# Patient Record
Sex: Female | Born: 1945 | Race: White | Hispanic: No | Marital: Married | Smoking: Former smoker
Health system: Southern US, Community
[De-identification: ages and names within clinical notes are randomized; demographics above are authoritative.]

## PROBLEM LIST (undated history)

## (undated) DIAGNOSIS — I6529 Occlusion and stenosis of unspecified carotid artery: Secondary | ICD-10-CM

## (undated) DIAGNOSIS — I219 Acute myocardial infarction, unspecified: Secondary | ICD-10-CM

## (undated) DIAGNOSIS — I10 Essential (primary) hypertension: Secondary | ICD-10-CM

## (undated) DIAGNOSIS — H269 Unspecified cataract: Secondary | ICD-10-CM

## (undated) DIAGNOSIS — E785 Hyperlipidemia, unspecified: Secondary | ICD-10-CM

## (undated) DIAGNOSIS — M1990 Unspecified osteoarthritis, unspecified site: Secondary | ICD-10-CM

## (undated) DIAGNOSIS — I2510 Atherosclerotic heart disease of native coronary artery without angina pectoris: Secondary | ICD-10-CM

## (undated) HISTORY — DX: Occlusion and stenosis of unspecified carotid artery: I65.29

## (undated) HISTORY — DX: Essential (primary) hypertension: I10

## (undated) HISTORY — DX: Unspecified osteoarthritis, unspecified site: M19.90

## (undated) HISTORY — PX: CARDIAC CATHETERIZATION: SHX172

## (undated) HISTORY — DX: Atherosclerotic heart disease of native coronary artery without angina pectoris: I25.10

## (undated) HISTORY — DX: Hyperlipidemia, unspecified: E78.5

## (undated) HISTORY — DX: Acute myocardial infarction, unspecified: I21.9

## (undated) HISTORY — DX: Unspecified cataract: H26.9

---
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
## 1995-12-11 HISTORY — PX: CLAVICLE SURGERY: SHX598

## 2012-12-10 HISTORY — PX: AORTIC VALVE REPLACEMENT (AVR)/CORONARY ARTERY BYPASS GRAFTING (CABG): SHX5725

## 2013-04-09 DIAGNOSIS — I219 Acute myocardial infarction, unspecified: Secondary | ICD-10-CM

## 2013-04-09 HISTORY — DX: Acute myocardial infarction, unspecified: I21.9

## 2013-06-08 DIAGNOSIS — Z951 Presence of aortocoronary bypass graft: Secondary | ICD-10-CM | POA: Insufficient documentation

## 2013-09-02 DIAGNOSIS — I6529 Occlusion and stenosis of unspecified carotid artery: Secondary | ICD-10-CM | POA: Insufficient documentation

## 2014-01-21 DIAGNOSIS — E785 Hyperlipidemia, unspecified: Secondary | ICD-10-CM | POA: Insufficient documentation

## 2014-01-21 DIAGNOSIS — I255 Ischemic cardiomyopathy: Secondary | ICD-10-CM | POA: Insufficient documentation

## 2014-10-18 HISTORY — PX: CAROTID ENDARTERECTOMY: SUR193

## 2015-02-17 DIAGNOSIS — C519 Malignant neoplasm of vulva, unspecified: Secondary | ICD-10-CM | POA: Insufficient documentation

## 2015-07-15 ENCOUNTER — Ambulatory Visit: Payer: Medicare Other | Attending: Gynecology | Admitting: Gynecology

## 2015-07-15 ENCOUNTER — Encounter: Payer: Self-pay | Admitting: Gynecology

## 2015-07-15 VITALS — BP 178/80 | HR 94 | Temp 98.2°F | Resp 20 | Ht 63.0 in | Wt 175.8 lb

## 2015-07-15 DIAGNOSIS — C519 Malignant neoplasm of vulva, unspecified: Secondary | ICD-10-CM | POA: Diagnosis present

## 2015-07-15 DIAGNOSIS — I2510 Atherosclerotic heart disease of native coronary artery without angina pectoris: Secondary | ICD-10-CM | POA: Insufficient documentation

## 2015-07-15 NOTE — Progress Notes (Signed)
Consult Note: Gyn-Onc   Amy Mayer 69 y.o. female  Chief Complaint  Patient presents with  . Vulvar Cancer    New consult    Assessment : Recurrent carcinoma of the vulva (right), new hypermetabolic external iliac lymph node.  Plan: I discussed management options with the patient. Clearly she has had maximum radiation to the vulva and chemotherapy is not particularly effective in controlling disease. Therefore, I recommended that she undergo a radical resection of the right vulvar lesion. Given her prior radiation therapy she is informed that healing will likely be a problem and that intraoperative decision will need to be made as to whether the wound will be closed at all. She is further informed that it may take several weeks for the wound to heal by secondary intention. The patient is informed that this is a palliative procedure.  With regard to the external iliac lymph node and possible pulmonary lesions, we will reassess the patient following surgery.  HPI: 69 year old white married female seen in consultation at the request of Dr. Marguerita Merles (radiation oncologist at [facility], [place]) regarding management of recurrent vulvar carcinoma.  The patient initially presented in the spring of 2016 with an enlarging right vulvar mass involving the right vulva measured approximate 8 x 4 x 3 cm. Biopsy revealed squamous cell carcinoma. Patient underwent a PET scan showing uptake in bilateral inguinal nodes and 3 nonspecific lung nodules. She received definitive chemoradiation therapy. Radiation course extended between 02/17/2015 and 04/19/2015. She received a total of 6300 cGy in 35 fractions to all PET avid disease and 4500 cGy in 25 fractions to all areas at risk of harboring microscopic disease including the pelvis. She had one break in therapy due to vulvitis. Initial follow-up examination showed complete response although on July 27 Dr. Erlene Quan found a  ulcerative lesion consistent with recurrent disease. A subsequent PET scan showed some residual activity in the vulvar region, resolution of left inguinal nodes, decrease in right inguinal nodes (although some activity remains) and 2 new right external iliac nodes. The 3 pulmonary nodules are stable although there is a new 5 mm pulmonary nodule as well.  Presently the patient has moderate amount of pain in the vulvar region and slight discomfort in her inguinal regions. She has mild lymphedema in the lower extremities.   Review of Systems:10 point review of systems is negative except as noted in interval history.   Vitals: Blood pressure 178/80, pulse 94, temperature 98.2 F (36.8 C), temperature source Oral, resp. rate 20, height 5\' 3"  (1.6 m), weight 175 lb 12.8 oz (79.742 kg), SpO2 96 %.  Physical Exam: General : The patient is a healthy woman in no acute distress.  HEENT: normocephalic, extraoccular movements normal; neck is supple without thyromegally  Lynphnodes: Supraclavicular and inguinal nodes not enlarged . There is thickening in the inguinal regions secondary to recent radiation therapy and "tanning" of the skin. Abdomen: Soft, non-tender, no ascites, no organomegally, no masses, no hernias  Pelvic:  EGBUS: There is a 3-1/2 cm ulcerative lesion on the right posterior vulva extending slightly into the vagina. It is not involving the anus and does not seem to be fixed although the patient is a considerable pain when I'm palpating. I'm unable to insert a speculum. Vagina:  Urethra and Bladder: Normal, non-tender  Cervix: Unable to visualize. Uterus: Could not evaluate.   Rectal: normal sphincter tone, no masses, no blood , the anus and rectum are free from the ulcerative lesion.  Lower extremities: Mild lymphedema in the upper thighs.     Allergies  Allergen Reactions  . Aspirin Other (See Comments)    "Only uncoated" makes stomach upset  . Codeine Nausea And Vomiting  .  Penicillins Hives  . Morphine Anxiety    Past Medical History  Diagnosis Date  . Arthritis   . Hypertension   . Myocardial infarction 04/2013  . Hyperlipidemia   . Coronary artery disease   . Cataracts, bilateral   . Carotid stenosis     Past Surgical History  Procedure Laterality Date  . Tonsillectomy  1964  . Aortic valve replacement (avr)/coronary artery bypass grafting (cabg)  2014  . Carotid endarterectomy  10/18/2014  . Clavicle surgery  1997  . Cardiac catheterization      Current Outpatient Prescriptions  Medication Sig Dispense Refill  . acetaminophen (TYLENOL) 500 MG tablet Take 500 mg by mouth as needed.     Marland Kitchen aspirin EC 81 MG tablet Take 81 mg by mouth daily.     . cetirizine (ZYRTEC) 10 MG tablet Take 10 mg by mouth as needed.     . Cholecalciferol (VITAMIN D-3) 1000 UNITS CAPS Take 1,000 Units by mouth daily.     . Magnesium 250 MG TABS Take 2 tablets by mouth daily.     . Multiple Vitamin (THERA) TABS Take 1 tablet by mouth daily.     . Omega-3 Fatty Acids (FISH OIL) 1000 MG CAPS Take 2 capsules by mouth daily.     . Potassium Gluconate 2 MEQ TABS Take 400 mg by mouth.    . pravastatin (PRAVACHOL) 80 MG tablet Take 80 mg by mouth daily.     . prochlorperazine (COMPAZINE) 10 MG tablet Take 10 mg by mouth as needed.      No current facility-administered medications for this visit.    History   Social History  . Marital Status: Married    Spouse Name: N/A  . Number of Children: N/A  . Years of Education: N/A   Occupational History  . Not on file.   Social History Main Topics  . Smoking status: Former Smoker    Quit date: 05/08/2013  . Smokeless tobacco: Not on file  . Alcohol Use: Yes     Comment: wine - several times a week  . Drug Use: No  . Sexual Activity: No   Other Topics Concern  . Not on file   Social History Narrative  . No narrative on file    Family History  Problem Relation Age of Onset  . Hypertension Father   . Coronary  artery disease Father   . Alcoholism Father   . Diabetes Maternal Aunt   . Arthritis Mother   . Heart disease Brother       Alvino Chapel, MD 07/15/2015, 10:08 AM       Consult Note: Gyn-Onc   Amy Mayer 69 y.o. female  Chief Complaint  Patient presents with  . Vulvar Cancer    New consult    Assessment :  Plan:  Interval History:   HPI:  Review of Systems:10 point review of systems is negative except as noted in interval history.   Vitals: Blood pressure 178/80, pulse 94, temperature 98.2 F (36.8 C), temperature source Oral, resp. rate 20, height 5\' 3"  (1.6 m), weight 175 lb 12.8 oz (79.742 kg), SpO2 96 %.  Physical Exam: General : The patient is a healthy woman in no acute distress.  HEENT: normocephalic, extraoccular movements normal;  neck is supple without thyromegally  Lynphnodes: Supraclavicular and inguinal nodes not enlarged  Abdomen: Soft, non-tender, no ascites, no organomegally, no masses, no hernias  Pelvic:  EGBUS: Normal female  Vagina: Normal, no lesions  Urethra and Bladder: Normal, non-tender  Cervix: Surgically absent  Uterus: Surgically absent  Bi-manual examination: Non-tender; no adenxal masses or nodularity  Rectal: normal sphincter tone, no masses, no blood  Lower extremities: No edema or varicosities. Normal range of motion      Allergies  Allergen Reactions  . Aspirin Other (See Comments)    "Only uncoated" makes stomach upset  . Codeine Nausea And Vomiting  . Penicillins Hives  . Morphine Anxiety    Past Medical History  Diagnosis Date  . Arthritis   . Hypertension   . Myocardial infarction 04/2013  . Hyperlipidemia   . Coronary artery disease   . Cataracts, bilateral   . Carotid stenosis     Past Surgical History  Procedure Laterality Date  . Tonsillectomy  1964  . Aortic valve replacement (avr)/coronary artery bypass grafting (cabg)  2014  . Carotid endarterectomy  10/18/2014  . Clavicle  surgery  1997  . Cardiac catheterization      Current Outpatient Prescriptions  Medication Sig Dispense Refill  . acetaminophen (TYLENOL) 500 MG tablet Take 500 mg by mouth as needed.     Marland Kitchen aspirin EC 81 MG tablet Take 81 mg by mouth daily.     . cetirizine (ZYRTEC) 10 MG tablet Take 10 mg by mouth as needed.     . Cholecalciferol (VITAMIN D-3) 1000 UNITS CAPS Take 1,000 Units by mouth daily.     . Magnesium 250 MG TABS Take 2 tablets by mouth daily.     . Multiple Vitamin (THERA) TABS Take 1 tablet by mouth daily.     . Omega-3 Fatty Acids (FISH OIL) 1000 MG CAPS Take 2 capsules by mouth daily.     . Potassium Gluconate 2 MEQ TABS Take 400 mg by mouth.    . pravastatin (PRAVACHOL) 80 MG tablet Take 80 mg by mouth daily.     . prochlorperazine (COMPAZINE) 10 MG tablet Take 10 mg by mouth as needed.      No current facility-administered medications for this visit.    History   Social History  . Marital Status: Married    Spouse Name: N/A  . Number of Children: N/A  . Years of Education: N/A   Occupational History  . Not on file.   Social History Main Topics  . Smoking status: Former Smoker    Quit date: 05/08/2013  . Smokeless tobacco: Not on file  . Alcohol Use: Yes     Comment: wine - several times a week  . Drug Use: No  . Sexual Activity: No   Other Topics Concern  . Not on file   Social History Narrative  . No narrative on file    Family History  Problem Relation Age of Onset  . Hypertension Father   . Coronary artery disease Father   . Alcoholism Father   . Diabetes Maternal Aunt   . Arthritis Mother   . Heart disease Brother       Alvino Chapel, MD 07/15/2015, 10:08 AM

## 2015-07-15 NOTE — Patient Instructions (Signed)
Plan for surgery with Dr. Fermin Schwab at [facility] on Tuesday, August 16.  Your surgery will include a right radical vulvectomy.  Your pre-operative appointment at [facility]) on Monday, August 8 at 11am.  Please call for any questions or concerns.

## 2015-08-05 ENCOUNTER — Ambulatory Visit: Payer: Medicare Other | Attending: Gynecology | Admitting: Gynecology

## 2015-08-05 ENCOUNTER — Encounter: Payer: Self-pay | Admitting: Gynecology

## 2015-08-05 VITALS — BP 147/61 | HR 69 | Temp 98.2°F | Resp 18 | Ht 63.0 in | Wt 173.6 lb

## 2015-08-05 DIAGNOSIS — C519 Malignant neoplasm of vulva, unspecified: Secondary | ICD-10-CM | POA: Insufficient documentation

## 2015-08-05 NOTE — Patient Instructions (Addendum)
Followup in 2 weeks as scheduled. Please call our office with any questions or concerns prior to this appointment.   Use the sitz bath twice daily and apply the silvadene cream twice daily (after sitz bath) until your next appointment.

## 2015-08-05 NOTE — Progress Notes (Signed)
Consult Note: Gyn-Onc   Amy Mayer 69 y.o. female  Chief Complaint  Patient presents with  . recurrent vulvar cancer    post-op check    Assessment : Recurrent carcinoma of the vulva (right) status post radical vulvectomy on 07/26/2015. Perineal wound separation.  Plan: I recommend the patient have a sitz bath twice a day, use the hand-held showerhead to irrigate the perineum, at the area dry, and then apply Silvadene cream. She return to see me in 2 weeks.  Interval history: Patient underwent extended right radical vulvectomy at [facility] on 07/26/2015. 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 Patient's initial postoperative course was uncomplicated. However she reports that in the last 2 or 3 days she's had some increased peritoneal drainage. She denies any fever or chills.  HPI: 69 year old white married female seen in consultation at the request of Dr. Marguerita Merles (radiation oncologist at [facility], [place]) regarding management of recurrent vulvar carcinoma.  The patient initially presented in the spring of 2016 with an enlarging right vulvar mass involving the right vulva measured approximate 8 x 4 x 3 cm. Biopsy revealed squamous cell carcinoma. Patient underwent a PET scan showing uptake in bilateral inguinal nodes and 3 nonspecific lung nodules. She received definitive chemoradiation therapy. Radiation course extended between 02/17/2015 and 04/19/2015. She received a total of 6300 cGy in 35 fractions to all PET avid disease and 4500 cGy in 25 fractions to all areas at risk of harboring microscopic disease including the pelvis. She had one break in therapy due to vulvitis. Initial follow-up examination showed complete response although on July 27 Dr. Erlene Quan found a ulcerative lesion consistent with recurrent disease. A subsequent PET scan showed some residual activity in the vulvar region,  resolution of left inguinal nodes, decrease in right inguinal nodes (although some activity remains) and 2 new right external iliac nodes. The 3 pulmonary nodules are stable although there is a new 5 mm pulmonary nodule as well.  Presently the patient has moderate amount of pain in the vulvar region and slight discomfort in her inguinal regions. She has mild lymphedema in the lower extremities.   She underwent extended radical vulvectomy of the right vulvar lesion on 07/26/2015 at [facility]. All surgical margins were negative.  Review of Systems:10 point review of systems is negative except as noted in interval history.   Vitals: Blood pressure 147/61, pulse 69, temperature 98.2 F (36.8 C), temperature source Oral, resp. rate 18, height 5\' 3"  (1.6 m), weight 173 lb 9.6 oz (78.744 kg), SpO2 98 %.  Physical Exam: General : The patient is a healthy woman in no acute distress.  HEENT: normocephalic, extraoccular movements normal; neck is supple without thyromegally  Lynphnodes: Supraclavicular and inguinal nodes not enlarged . There is thickening in the inguinal regions secondary to recent radiation therapy and "tanning" of the skin. Abdomen: Soft, non-tender, no ascites, no organomegally, no masses, no hernias  Pelvic:  EGBUS:  Status post radical vulvectomy. The advancement flaps seen to be healing well at the present time. However in the midline perineum there is separation of the wound with early granulation tissue forming.   Urethra and Bladder: Normal, non-tender  Cervix: Unable to visualize. Uterus: Could not evaluate.   Rectal: normal sphincter tone, no masses, no blood , the anus and rectum are free from the ulcerative lesion. Lower extremities: Mild lymphedema in the upper thighs.     Allergies  Allergen Reactions  .  Aspirin Other (See Comments)    "Only uncoated" makes stomach upset  . Codeine Nausea And Vomiting  . Penicillins Hives  . Morphine Anxiety    Past  Medical History  Diagnosis Date  . Arthritis   . Hypertension   . Myocardial infarction 04/2013  . Hyperlipidemia   . Coronary artery disease   . Cataracts, bilateral   . Carotid stenosis     Past Surgical History  Procedure Laterality Date  . Tonsillectomy  1964  . Aortic valve replacement (avr)/coronary artery bypass grafting (cabg)  2014  . Carotid endarterectomy  10/18/2014  . Clavicle surgery  1997  . Cardiac catheterization      Current Outpatient Prescriptions  Medication Sig Dispense Refill  . acetaminophen (TYLENOL) 500 MG tablet Take 500 mg by mouth as needed.     Marland Kitchen aspirin EC 81 MG tablet Take 81 mg by mouth daily.     . cetirizine (ZYRTEC) 10 MG tablet Take 10 mg by mouth as needed.     . Cholecalciferol (VITAMIN D-3) 1000 UNITS CAPS Take 1,000 Units by mouth daily.     . clindamycin (CLEOCIN) 300 MG capsule Take 300 mg by mouth 4 (four) times daily.     . Magnesium 250 MG TABS Take 2 tablets by mouth daily.     . Multiple Vitamin (THERA) TABS Take 1 tablet by mouth daily.     . Omega-3 Fatty Acids (FISH OIL) 1000 MG CAPS Take 2 capsules by mouth daily.     . Potassium Gluconate 2 MEQ TABS Take 400 mg by mouth daily.     . pravastatin (PRAVACHOL) 80 MG tablet Take 80 mg by mouth daily.     . silver sulfADIAZINE (SILVADENE) 1 % cream Apply topically daily as needed.     Marland Kitchen ibuprofen (ADVIL,MOTRIN) 600 MG tablet Take 600 mg by mouth.    . prochlorperazine (COMPAZINE) 10 MG tablet Take 10 mg by mouth as needed.      No current facility-administered medications for this visit.    Social History   Social History  . Marital Status: Married    Spouse Name: N/A  . Number of Children: N/A  . Years of Education: N/A   Occupational History  . Not on file.   Social History Main Topics  . Smoking status: Former Smoker    Quit date: 05/08/2013  . Smokeless tobacco: Not on file  . Alcohol Use: Yes     Comment: wine - several times a week  . Drug Use: No  . Sexual  Activity: No   Other Topics Concern  . Not on file   Social History Narrative    Family History  Problem Relation Age of Onset  . Hypertension Father   . Coronary artery disease Father   . Alcoholism Father   . Diabetes Maternal Aunt   . Arthritis Mother   . Heart disease Brother       Alvino Chapel, MD 08/05/2015, 2:07 PM       Consult Note: Gyn-Onc   Amy Mayer 69 y.o. female  Chief Complaint  Patient presents with  . recurrent vulvar cancer    post-op check    Assessment :  Plan:  Interval History:   HPI:  Review of Systems:10 point review of systems is negative except as noted in interval history.   Vitals: Blood pressure 147/61, pulse 69, temperature 98.2 F (36.8 C), temperature source Oral, resp. rate 18, height 5\' 3"  (1.6 m), weight 173  lb 9.6 oz (78.744 kg), SpO2 98 %.  Physical Exam: General : The patient is a healthy woman in no acute distress.  HEENT: normocephalic, extraoccular movements normal; neck is supple without thyromegally  Lynphnodes: Supraclavicular and inguinal nodes not enlarged  Abdomen: Soft, non-tender, no ascites, no organomegally, no masses, no hernias  Pelvic:  EGBUS: Normal female  Vagina: Normal, no lesions  Urethra and Bladder: Normal, non-tender  Cervix: Surgically absent  Uterus: Surgically absent  Bi-manual examination: Non-tender; no adenxal masses or nodularity  Rectal: normal sphincter tone, no masses, no blood  Lower extremities: No edema or varicosities. Normal range of motion      Allergies  Allergen Reactions  . Aspirin Other (See Comments)    "Only uncoated" makes stomach upset  . Codeine Nausea And Vomiting  . Penicillins Hives  . Morphine Anxiety    Past Medical History  Diagnosis Date  . Arthritis   . Hypertension   . Myocardial infarction 04/2013  . Hyperlipidemia   . Coronary artery disease   . Cataracts, bilateral   . Carotid stenosis     Past Surgical History   Procedure Laterality Date  . Tonsillectomy  1964  . Aortic valve replacement (avr)/coronary artery bypass grafting (cabg)  2014  . Carotid endarterectomy  10/18/2014  . Clavicle surgery  1997  . Cardiac catheterization      Current Outpatient Prescriptions  Medication Sig Dispense Refill  . acetaminophen (TYLENOL) 500 MG tablet Take 500 mg by mouth as needed.     Marland Kitchen aspirin EC 81 MG tablet Take 81 mg by mouth daily.     . cetirizine (ZYRTEC) 10 MG tablet Take 10 mg by mouth as needed.     . Cholecalciferol (VITAMIN D-3) 1000 UNITS CAPS Take 1,000 Units by mouth daily.     . clindamycin (CLEOCIN) 300 MG capsule Take 300 mg by mouth 4 (four) times daily.     . Magnesium 250 MG TABS Take 2 tablets by mouth daily.     . Multiple Vitamin (THERA) TABS Take 1 tablet by mouth daily.     . Omega-3 Fatty Acids (FISH OIL) 1000 MG CAPS Take 2 capsules by mouth daily.     . Potassium Gluconate 2 MEQ TABS Take 400 mg by mouth daily.     . pravastatin (PRAVACHOL) 80 MG tablet Take 80 mg by mouth daily.     . silver sulfADIAZINE (SILVADENE) 1 % cream Apply topically daily as needed.     Marland Kitchen ibuprofen (ADVIL,MOTRIN) 600 MG tablet Take 600 mg by mouth.    . prochlorperazine (COMPAZINE) 10 MG tablet Take 10 mg by mouth as needed.      No current facility-administered medications for this visit.    Social History   Social History  . Marital Status: Married    Spouse Name: N/A  . Number of Children: N/A  . Years of Education: N/A   Occupational History  . Not on file.   Social History Main Topics  . Smoking status: Former Smoker    Quit date: 05/08/2013  . Smokeless tobacco: Not on file  . Alcohol Use: Yes     Comment: wine - several times a week  . Drug Use: No  . Sexual Activity: No   Other Topics Concern  . Not on file   Social History Narrative    Family History  Problem Relation Age of Onset  . Hypertension Father   . Coronary artery disease Father   . Alcoholism  Father   .  Diabetes Maternal Aunt   . Arthritis Mother   . Heart disease Brother       Alvino Chapel, MD 08/05/2015, 2:07 PM

## 2015-08-19 ENCOUNTER — Ambulatory Visit: Payer: Medicare Other | Attending: Gynecologic Oncology | Admitting: Gynecologic Oncology

## 2015-08-19 ENCOUNTER — Encounter: Payer: Self-pay | Admitting: Gynecologic Oncology

## 2015-08-19 VITALS — BP 154/68 | HR 81 | Temp 98.2°F | Resp 18 | Ht 63.0 in | Wt 172.6 lb

## 2015-08-19 DIAGNOSIS — T8149XA Infection following a procedure, other surgical site, initial encounter: Secondary | ICD-10-CM | POA: Insufficient documentation

## 2015-08-19 DIAGNOSIS — IMO0001 Reserved for inherently not codable concepts without codable children: Secondary | ICD-10-CM

## 2015-08-19 DIAGNOSIS — C519 Malignant neoplasm of vulva, unspecified: Secondary | ICD-10-CM | POA: Insufficient documentation

## 2015-08-19 DIAGNOSIS — T814XXA Infection following a procedure, initial encounter: Secondary | ICD-10-CM | POA: Insufficient documentation

## 2015-08-19 MED ORDER — CLINDAMYCIN HCL 150 MG PO CAPS
300.0000 mg | ORAL_CAPSULE | Freq: Three times a day (TID) | ORAL | Status: DC
Start: 2015-08-19 — End: 2015-09-12

## 2015-08-19 NOTE — Progress Notes (Signed)
Consult Note: Gyn-Onc   Edit Amy Mayer 69 y.o. female  Chief Complaint  Patient presents with  . 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 Perineal wound separation. Postoperative wound infection. Debrided today.  Plan: I recommend the patient continue a sitz bath twice a day, use the hand-held showerhead to irrigate the perineum, at the area dry, and then continue to apply Silvadene cream. I taught her to apply a wet gauze and to change this as a wet to dry dressing with each toiletting event and evenings and mornings. Empiric clindamycin x 10 days. Return to see me in 10 days.  Interval history: Patient underwent extended right radical vulvectomy at [facility] on 07/26/2015. 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 Patient's initial postoperative course was uncomplicated. However she reports that in the last 2 or 3 days she's had some increased peritoneal drainage. She denies any fever or chills.  HPI: 69 year old white married female seen in consultation at the request of Dr. Marguerita Merles (radiation oncologist at [facility]) regarding management of recurrent vulvar carcinoma.  The patient initially presented in the spring of 2016 with an enlarging right vulvar mass involving the right vulva measured approximate 8 x 4 x 3 cm. Biopsy revealed squamous cell carcinoma. Patient underwent a PET scan showing uptake in bilateral inguinal nodes and 3 nonspecific lung nodules. She received definitive chemoradiation therapy. Radiation course extended between 02/17/2015 and 04/19/2015. She received a total of 6300 cGy in 35 fractions to all PET avid disease and 4500 cGy in 25 fractions to all areas at risk of harboring microscopic disease including the pelvis. She had one break in therapy due to vulvitis. Initial  follow-up examination showed complete response although on July 27 Dr. Erlene Quan found a ulcerative lesion consistent with recurrent disease. A subsequent PET scan showed some residual activity in the vulvar region, resolution of left inguinal nodes, decrease in right inguinal nodes (although some activity remains) and 2 new right external iliac nodes. The 3 pulmonary nodules are stable although there is a new 5 mm pulmonary nodule as well.  She underwent extended radical vulvectomy of the right vulvar lesion on 07/26/2015 at [facility]. All surgical margins were negative.  Presently the patient has moderate amount of pain in the vulvar region and slight discomfort in her inguinal regions. She has developed drainage of foul smelling fluid.   Review of Systems:10 point review of systems is negative except as noted in interval history.   Vitals: Blood pressure 154/68, pulse 81, temperature 98.2 F (36.8 C), temperature source Oral, resp. rate 18, height 5\' 3"  (1.6 m), weight 172 lb 9.6 oz (78.291 kg).  Physical Exam: General : The patient is a healthy woman in no acute distress.  HEENT: normocephalic, extraoccular movements normal; neck is supple without thyromegally  Lynphnodes: Supraclavicular and inguinal nodes not enlarged . There is thickening in the inguinal regions secondary to recent radiation therapy and "tanning" of the skin. Abdomen: Soft, non-tender, no ascites, no organomegally, no masses, no hernias  Pelvic:  EGBUS:  Status post radical vulvectomy. The advancement flaps seen to be healing well at the present time. However in the midline perineum there is separation of the wound. Large 6cm crater at perineum and fibrinous debris. Erythema of surrounding skin edges but not spreading onto surrounding skin. There is a thick layer of fibrinous exudate  on base.  Procedure: verbal consent was obtained by the patient. Using sharp dissection, the bed of fibrinous exudate was debrided, and guaze  was used to clean the base to bleeding granulation tissue. The wound was packed with wet to dry dressings.  Urethra and Bladder: Normal, non-tender  Cervix: Unable to visualize. Uterus: Could not evaluate.   Rectal: normal sphincter tone, no masses, no blood , the anus and rectum are free from the ulcerative lesion. Lower extremities: Mild lymphedema in the upper thighs.     Allergies  Allergen Reactions  . Aspirin Other (See Comments)    "Only uncoated" makes stomach upset  . Codeine Nausea And Vomiting  . Penicillins Hives  . Morphine Anxiety    Past Medical History  Diagnosis Date  . Arthritis   . Hypertension   . Myocardial infarction 04/2013  . Hyperlipidemia   . Coronary artery disease   . Cataracts, bilateral   . Carotid stenosis     Past Surgical History  Procedure Laterality Date  . Tonsillectomy  1964  . Aortic valve replacement (avr)/coronary artery bypass grafting (cabg)  2014  . Carotid endarterectomy  10/18/2014  . Clavicle surgery  1997  . Cardiac catheterization      Current Outpatient Prescriptions  Medication Sig Dispense Refill  . aspirin EC 81 MG tablet Take 81 mg by mouth daily.     . cetirizine (ZYRTEC) 10 MG tablet Take 10 mg by mouth as needed.     . Cholecalciferol (VITAMIN D-3) 1000 UNITS CAPS Take 1,000 Units by mouth daily.     . clindamycin (CLEOCIN) 150 MG capsule Take 2 capsules (300 mg total) by mouth 3 (three) times daily. 60 capsule 1  . Magnesium 250 MG TABS Take 2 tablets by mouth daily.     . Multiple Vitamin (THERA) TABS Take 1 tablet by mouth daily.     . Omega-3 Fatty Acids (FISH OIL) 1000 MG CAPS Take 2 capsules by mouth daily.     . Potassium Gluconate 2 MEQ TABS Take 400 mg by mouth daily.     . pravastatin (PRAVACHOL) 80 MG tablet Take 80 mg by mouth daily.     . prochlorperazine (COMPAZINE) 10 MG tablet Take 10 mg by mouth as needed.     . silver sulfADIAZINE (SILVADENE) 1 % cream Apply topically daily as needed.      No  current facility-administered medications for this visit.    Social History   Social History  . Marital Status: Married    Spouse Name: N/A  . Number of Children: N/A  . Years of Education: N/A   Occupational History  . Not on file.   Social History Main Topics  . Smoking status: Former Smoker    Quit date: 05/08/2013  . Smokeless tobacco: Not on file  . Alcohol Use: Yes     Comment: wine - several times a week  . Drug Use: No  . Sexual Activity: No   Other Topics Concern  . Not on file   Social History Narrative    Family History  Problem Relation Age of Onset  . Hypertension Father   . Coronary artery disease Father   . Alcoholism Father   . Diabetes Maternal Aunt   . Arthritis Mother   . Heart disease Brother       Donaciano Eva, MD 08/19/2015, 2:06 PM       Consult Note: Amy Mayer 69 y.o. female  Chief Complaint  Patient presents with  . Vulvar Cancer    post-op f/u    Assessment :  Plan:  Interval History:   HPI:  Review of Systems:10 point review of systems is negative except as noted in interval history.   Vitals: Blood pressure 154/68, pulse 81, temperature 98.2 F (36.8 C), temperature source Oral, resp. rate 18, height 5\' 3"  (1.6 m), weight 172 lb 9.6 oz (78.291 kg).  Physical Exam: General : The patient is a healthy woman in no acute distress.  HEENT: normocephalic, extraoccular movements normal; neck is supple without thyromegally  Lynphnodes: Supraclavicular and inguinal nodes not enlarged  Abdomen: Soft, non-tender, no ascites, no organomegally, no masses, no hernias  Pelvic:  EGBUS: Normal female  Vagina: Normal, no lesions  Urethra and Bladder: Normal, non-tender  Cervix: Surgically absent  Uterus: Surgically absent  Bi-manual examination: Non-tender; no adenxal masses or nodularity  Rectal: normal sphincter tone, no masses, no blood  Lower extremities: No edema or varicosities. Normal range of  motion      Allergies  Allergen Reactions  . Aspirin Other (See Comments)    "Only uncoated" makes stomach upset  . Codeine Nausea And Vomiting  . Penicillins Hives  . Morphine Anxiety    Past Medical History  Diagnosis Date  . Arthritis   . Hypertension   . Myocardial infarction 04/2013  . Hyperlipidemia   . Coronary artery disease   . Cataracts, bilateral   . Carotid stenosis     Past Surgical History  Procedure Laterality Date  . Tonsillectomy  1964  . Aortic valve replacement (avr)/coronary artery bypass grafting (cabg)  2014  . Carotid endarterectomy  10/18/2014  . Clavicle surgery  1997  . Cardiac catheterization      Current Outpatient Prescriptions  Medication Sig Dispense Refill  . aspirin EC 81 MG tablet Take 81 mg by mouth daily.     . cetirizine (ZYRTEC) 10 MG tablet Take 10 mg by mouth as needed.     . Cholecalciferol (VITAMIN D-3) 1000 UNITS CAPS Take 1,000 Units by mouth daily.     . clindamycin (CLEOCIN) 150 MG capsule Take 2 capsules (300 mg total) by mouth 3 (three) times daily. 60 capsule 1  . Magnesium 250 MG TABS Take 2 tablets by mouth daily.     . Multiple Vitamin (THERA) TABS Take 1 tablet by mouth daily.     . Omega-3 Fatty Acids (FISH OIL) 1000 MG CAPS Take 2 capsules by mouth daily.     . Potassium Gluconate 2 MEQ TABS Take 400 mg by mouth daily.     . pravastatin (PRAVACHOL) 80 MG tablet Take 80 mg by mouth daily.     . prochlorperazine (COMPAZINE) 10 MG tablet Take 10 mg by mouth as needed.     . silver sulfADIAZINE (SILVADENE) 1 % cream Apply topically daily as needed.      No current facility-administered medications for this visit.    Social History   Social History  . Marital Status: Married    Spouse Name: N/A  . Number of Children: N/A  . Years of Education: N/A   Occupational History  . Not on file.   Social History Main Topics  . Smoking status: Former Smoker    Quit date: 05/08/2013  . Smokeless tobacco: Not on file   . Alcohol Use: Yes     Comment: wine - several times a week  . Drug Use: No  . Sexual Activity: No   Other Topics  Concern  . Not on file   Social History Narrative    Family History  Problem Relation Age of Onset  . Hypertension Father   . Coronary artery disease Father   . Alcoholism Father   . Diabetes Maternal Aunt   . Arthritis Mother   . Heart disease Brother       Donaciano Eva, MD 08/19/2015, 2:06 PM

## 2015-08-19 NOTE — Patient Instructions (Signed)
Change the moistened gauze every time you urinate, have BM, etc.  Also several times a day use the handheld shower head to spray the area then repack gauze.  Plan to follow up in one week or sooner if needed.   Clindamycin capsules What is this medicine? CLINDAMYCIN (Gardiner sin) is a lincosamide antibiotic. It is used to treat certain kinds of bacterial infections. It will not work for colds, flu, or other viral infections. This medicine may be used for other purposes; ask your health care provider or pharmacist if you have questions. COMMON BRAND NAME(S): Cleocin What should I tell my health care provider before I take this medicine? They need to know if you have any of these conditions: -kidney disease -liver disease -stomach problems like colitis -an unusual or allergic reaction to clindamycin, lincomycin, or other medicines, foods, dyes like tartrazine or preservatives -pregnant or trying to get pregnant -breast-feeding How should I use this medicine? Take this medicine by mouth with a full glass of water. Follow the directions on the prescription label. You can take this medicine with food or on an empty stomach. If the medicine upsets your stomach, take it with food. Take your medicine at regular intervals. Do not take your medicine more often than directed. Take all of your medicine as directed even if you think your are better. Do not skip doses or stop your medicine early. Talk to your pediatrician regarding the use of this medicine in children. Special care may be needed. Overdosage: If you think you have taken too much of this medicine contact a poison control center or emergency room at once. NOTE: This medicine is only for you. Do not share this medicine with others. What if I miss a dose? If you miss a dose, take it as soon as you can. If it is almost time for your next dose, take only that dose. Do not take double or extra doses. What may interact with this medicine? -birth  control pills -chloramphenicol -erythromycin -kaolin products This list may not describe all possible interactions. Give your health care provider a list of all the medicines, herbs, non-prescription drugs, or dietary supplements you use. Also tell them if you smoke, drink alcohol, or use illegal drugs. Some items may interact with your medicine. What should I watch for while using this medicine? Tell your doctor or healthcare professional if your symptoms do not start to get better or if they get worse. Do not treat diarrhea with over the counter products. Contact your doctor if you have diarrhea that lasts more than 2 days or if it is severe and watery. What side effects may I notice from receiving this medicine? Side effects that you should report to your doctor or health care professional as soon as possible: -allergic reactions like skin rash, itching or hives, swelling of the face, lips, or tongue -dark urine -pain on swallowing -redness, blistering, peeling or loosening of the skin, including inside the mouth -unusual bleeding or bruising -unusually weak or tired -yellowing of eyes or skin Side effects that usually do not require medical attention (report to your doctor or health care professional if they continue or are bothersome): -diarrhea -itching in the rectal or genital area -joint pain -nausea, vomiting -stomach pain This list may not describe all possible side effects. Call your doctor for medical advice about side effects. You may report side effects to FDA at 1-800-FDA-1088. Where should I keep my medicine? Keep out of the reach of children.  Store at room temperature between 20 and 25 degrees C (68 and 77 degrees F). Throw away any unused medicine after the expiration date. NOTE: This sheet is a summary. It may not cover all possible information. If you have questions about this medicine, talk to your doctor, pharmacist, or health care provider.  2015, [employer]. (2013-07-02 16:12:32)

## 2015-08-26 ENCOUNTER — Ambulatory Visit: Payer: Medicare Other | Admitting: Gynecology

## 2015-08-29 ENCOUNTER — Encounter: Payer: Self-pay | Admitting: Gynecologic Oncology

## 2015-08-29 ENCOUNTER — Telehealth: Payer: Self-pay | Admitting: Oncology

## 2015-08-29 ENCOUNTER — Other Ambulatory Visit: Payer: Self-pay | Admitting: Oncology

## 2015-08-29 ENCOUNTER — Ambulatory Visit: Payer: Medicare Other | Attending: Gynecologic Oncology | Admitting: Gynecologic Oncology

## 2015-08-29 ENCOUNTER — Telehealth: Payer: Self-pay | Admitting: Gynecologic Oncology

## 2015-08-29 VITALS — BP 139/79 | HR 95 | Temp 98.0°F | Resp 16 | Ht 63.0 in | Wt 171.5 lb

## 2015-08-29 DIAGNOSIS — C519 Malignant neoplasm of vulva, unspecified: Secondary | ICD-10-CM

## 2015-08-29 DIAGNOSIS — R102 Pelvic and perineal pain: Secondary | ICD-10-CM | POA: Diagnosis not present

## 2015-08-29 MED ORDER — TRAMADOL HCL 50 MG PO TABS: 50.0000 mg | ORAL_TABLET | Freq: Four times a day (QID) | ORAL | Status: DC | PRN

## 2015-08-29 NOTE — Telephone Encounter (Signed)
New patient appointment made for Amy Mayer as Amy Mayer was out   [employer]

## 2015-08-29 NOTE — Progress Notes (Signed)
Consult Note: Gyn-Onc   Amy Mayer 69 y.o. female  Chief Complaint  Patient presents with  . 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 Perineal wound separation. Postoperative wound infection - healing.  Evidence of systemic disease on postop PET (pelvic lymphadenopathy, inguinal lymphadenopathy, increasing pulmonary nodule).  Plan: I recommend the patient continue a sitz bath twice a day, use the hand-held showerhead to irrigate the perineum, at the area dry, and then continue to apply a wet gauze and to change this as a wet to dry dressing with each toiletting event and evenings and mornings. Complete course of empiric clindamycin x 3 more days.  Systemic therapy with cisplatin and paclitaxel. Will make referral to Dr Marko Plume. I discussed with the patient that there is a very small chance for cure with recurrent widely metastatic vulvar cancer, however, given her relatively low burden of disease, she may achieve a partial response or complete temporary response to systemic therapy. I discussed why surgery and radiation are non-curative in the case of metastatic disease.  I will see her back in approximately 2 weeks to follow healing and plan for Dr Marko Plume to see her.    Interval history: Patient underwent extended right radical vulvectomy at [facility] on 07/26/2015. 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 Patient's initial postoperative course was uncomplicated. However she reports that in the last 2 or 3 days she's had some increased peritoneal drainage. She denies any fever or chills.  HPI: 69 year old white married female seen in consultation at the request of Dr. Marguerita Merles (radiation oncologist at [facility], [place]) regarding management of recurrent vulvar carcinoma.  The patient initially  presented in the spring of 2016 with an enlarging right vulvar mass involving the right vulva measured approximate 8 x 4 x 3 cm. Biopsy revealed squamous cell carcinoma. Patient underwent a PET scan showing uptake in bilateral inguinal nodes and 3 nonspecific lung nodules. She received definitive chemoradiation therapy. Radiation course extended between 02/17/2015 and 04/19/2015. She received a total of 6300 cGy in 35 fractions to all PET avid disease and 4500 cGy in 25 fractions to all areas at risk of harboring microscopic disease including the pelvis. She had one break in therapy due to vulvitis. Initial follow-up examination showed complete response although on July 27 Dr. Erlene Quan found a ulcerative lesion consistent with recurrent disease. A subsequent PET scan showed some residual activity in the vulvar region, resolution of left inguinal nodes, decrease in right inguinal nodes (although some activity remains) and 2 new right external iliac nodes. The 3 pulmonary nodules are stable although there is a new 5 mm pulmonary nodule as well.  She underwent extended radical vulvectomy of the right vulvar lesion on 07/26/2015 at [facility]. All surgical margins were negative.  She developed a postoperative wound separation and cellulitis which was debrided and then treated with wet to dry dressings.  Interval Hx: she has been doing the dressings with increasing ease. She saw her Radiation Oncologist, Dr Erlene Quan, in [place] last week and reviewed her PET from [facility].  On 08/23/15 it demonstrated a new 14 x 8 mm left inguinal lymph node that was FDG avid. An increase in hypermetabolic right inguinal node measuring 17 x 15 mm that was FDG avid. An increase in the size of a right upper lobe lung nodule now measuring 7 mm (from  5 mm) that was FDG avid. Scattered additional small lung nodules were stable in size. There was right external iliac lymph node measuring 10 mm that was FDG avid. A right obturator  lymph node measured 13 mm and was also FDG appendectomy. There was surrounding hypermetabolic activity in the region of the vulva.  Review of Systems:10 point review of systems is negative except as noted in interval history.   Vitals: Blood pressure 139/79, pulse 95, temperature 98 F (36.7 C), resp. rate 16, height 5\' 3"  (1.6 m), weight 171 lb 8 oz (77.792 kg).  Physical Exam: General : The patient is a healthy woman in no acute distress.  HEENT: normocephalic, extraoccular movements normal; neck is supple without thyromegally  Lynphnodes: Supraclavicular and inguinal nodes not enlarged . There is thickening in the inguinal regions secondary to recent radiation therapy and "tanning" of the skin. Abdomen: Soft, non-tender, no ascites, no organomegally, no masses, no hernias  Pelvic:  EGBUS:  Status post radical vulvectomy. The advancement flaps seen to be healing well at the present time. However in the midline perineum there is separation of the wound. Large 6cm crater at perineum with granulation tissue. Healing well. No tissue to debride. Dressing replaced.  Urethra and Bladder: Normal, non-tender  Cervix: Unable to visualize. Uterus: Could not evaluate.   Rectal: normal sphincter tone, no masses, no blood , the anus and rectum are free from the ulcerative lesion. Lower extremities: Mild lymphedema in the upper thighs.     Allergies  Allergen Reactions  . Aspirin Other (See Comments)    "Only uncoated" makes stomach upset  . Codeine Nausea And Vomiting  . Penicillins Hives  . Morphine Anxiety    Past Medical History  Diagnosis Date  . Arthritis   . Hypertension   . Myocardial infarction 04/2013  . Hyperlipidemia   . Coronary artery disease   . Cataracts, bilateral   . Carotid stenosis     Past Surgical History  Procedure Laterality Date  . Tonsillectomy  1964  . Aortic valve replacement (avr)/coronary artery bypass grafting (cabg)  2014  . Carotid endarterectomy   10/18/2014  . Clavicle surgery  1997  . Cardiac catheterization      Current Outpatient Prescriptions  Medication Sig Dispense Refill  . aspirin EC 81 MG tablet Take 81 mg by mouth daily.     . cetirizine (ZYRTEC) 10 MG tablet Take 10 mg by mouth as needed.     . Cholecalciferol (VITAMIN D-3) 1000 UNITS CAPS Take 1,000 Units by mouth daily.     . clindamycin (CLEOCIN) 150 MG capsule Take 2 capsules (300 mg total) by mouth 3 (three) times daily. 60 capsule 1  . Magnesium 250 MG TABS Take 2 tablets by mouth daily.     . Multiple Vitamin (THERA) TABS Take 1 tablet by mouth daily.     . Omega-3 Fatty Acids (FISH OIL) 1000 MG CAPS Take 2 capsules by mouth daily.     . Potassium Gluconate 2 MEQ TABS Take 400 mg by mouth daily.     . pravastatin (PRAVACHOL) 80 MG tablet Take 80 mg by mouth daily.     . prochlorperazine (COMPAZINE) 10 MG tablet Take 10 mg by mouth as needed.     . silver sulfADIAZINE (SILVADENE) 1 % cream Apply topically daily as needed.      No current facility-administered medications for this visit.    Social History   Social History  . Marital Status: Married  Spouse Name: N/A  . Number of Children: N/A  . Years of Education: N/A   Occupational History  . Not on file.   Social History Main Topics  . Smoking status: Former Smoker    Quit date: 05/08/2013  . Smokeless tobacco: Not on file  . Alcohol Use: Yes     Comment: wine - several times a week  . Drug Use: No  . Sexual Activity: No   Other Topics Concern  . Not on file   Social History Narrative    Family History  Problem Relation Age of Onset  . Hypertension Father   . Coronary artery disease Father   . Alcoholism Father   . Diabetes Maternal Aunt   . Arthritis Mother   . Heart disease Brother       Donaciano Eva, MD 08/29/2015, 10:41 AM       Consult Note: Gyn-Onc   Amy Mayer 69 y.o. female  Chief Complaint  Patient presents with  . Vulvar Cancer    Follow up     Assessment :  Plan:  Interval History:   HPI:  Review of Systems:10 point review of systems is negative except as noted in interval history.   Vitals: Blood pressure 139/79, pulse 95, temperature 98 F (36.7 C), resp. rate 16, height 5\' 3"  (1.6 m), weight 171 lb 8 oz (77.792 kg).  Physical Exam: General : The patient is a healthy woman in no acute distress.  HEENT: normocephalic, extraoccular movements normal; neck is supple without thyromegally  Lynphnodes: Supraclavicular and inguinal nodes not enlarged  Abdomen: Soft, non-tender, no ascites, no organomegally, no masses, no hernias  Pelvic:  EGBUS: Normal female  Vagina: Normal, no lesions  Urethra and Bladder: Normal, non-tender  Cervix: Surgically absent  Uterus: Surgically absent  Bi-manual examination: Non-tender; no adenxal masses or nodularity  Rectal: normal sphincter tone, no masses, no blood  Lower extremities: No edema or varicosities. Normal range of motion      Allergies  Allergen Reactions  . Aspirin Other (See Comments)    "Only uncoated" makes stomach upset  . Codeine Nausea And Vomiting  . Penicillins Hives  . Morphine Anxiety    Past Medical History  Diagnosis Date  . Arthritis   . Hypertension   . Myocardial infarction 04/2013  . Hyperlipidemia   . Coronary artery disease   . Cataracts, bilateral   . Carotid stenosis     Past Surgical History  Procedure Laterality Date  . Tonsillectomy  1964  . Aortic valve replacement (avr)/coronary artery bypass grafting (cabg)  2014  . Carotid endarterectomy  10/18/2014  . Clavicle surgery  1997  . Cardiac catheterization      Current Outpatient Prescriptions  Medication Sig Dispense Refill  . aspirin EC 81 MG tablet Take 81 mg by mouth daily.     . cetirizine (ZYRTEC) 10 MG tablet Take 10 mg by mouth as needed.     . Cholecalciferol (VITAMIN D-3) 1000 UNITS CAPS Take 1,000 Units by mouth daily.     . clindamycin (CLEOCIN) 150 MG capsule Take  2 capsules (300 mg total) by mouth 3 (three) times daily. 60 capsule 1  . Magnesium 250 MG TABS Take 2 tablets by mouth daily.     . Multiple Vitamin (THERA) TABS Take 1 tablet by mouth daily.     . Omega-3 Fatty Acids (FISH OIL) 1000 MG CAPS Take 2 capsules by mouth daily.     . Potassium Gluconate 2 MEQ  TABS Take 400 mg by mouth daily.     . pravastatin (PRAVACHOL) 80 MG tablet Take 80 mg by mouth daily.     . prochlorperazine (COMPAZINE) 10 MG tablet Take 10 mg by mouth as needed.     . silver sulfADIAZINE (SILVADENE) 1 % cream Apply topically daily as needed.      No current facility-administered medications for this visit.    Social History   Social History  . Marital Status: Married    Spouse Name: N/A  . Number of Children: N/A  . Years of Education: N/A   Occupational History  . Not on file.   Social History Main Topics  . Smoking status: Former Smoker    Quit date: 05/08/2013  . Smokeless tobacco: Not on file  . Alcohol Use: Yes     Comment: wine - several times a week  . Drug Use: No  . Sexual Activity: No   Other Topics Concern  . Not on file   Social History Narrative    Family History  Problem Relation Age of Onset  . Hypertension Father   . Coronary artery disease Father   . Alcoholism Father   . Diabetes Maternal Aunt   . Arthritis Mother   . Heart disease Brother       Donaciano Eva, MD 08/29/2015, 10:41 AM

## 2015-08-29 NOTE — Telephone Encounter (Signed)
Patient called stating she needs pain medication stronger than tylenol.  She has taken three tylenol with minimal pain relief.  She would like something called into her pharmacy.  Oxycodone "messes up my head."  Tramadol phoned in to [facility] in [place].  Patient to call the office if the pain persists or if she cannot tolerate tramadol.  Advised to call for any questions or concerns.

## 2015-08-30 ENCOUNTER — Other Ambulatory Visit: Payer: Medicare Other

## 2015-08-30 ENCOUNTER — Telehealth: Payer: Self-pay | Admitting: Oncology

## 2015-08-30 ENCOUNTER — Telehealth: Payer: Self-pay | Admitting: *Deleted

## 2015-08-30 NOTE — Telephone Encounter (Signed)
No additional note

## 2015-08-30 NOTE — Telephone Encounter (Signed)
Appointments for chemo ed adn lab rescheduled top 9/22 per alleta and the patient is awrare

## 2015-09-01 ENCOUNTER — Other Ambulatory Visit: Payer: Medicare Other

## 2015-09-01 ENCOUNTER — Ambulatory Visit (HOSPITAL_BASED_OUTPATIENT_CLINIC_OR_DEPARTMENT_OTHER): Payer: Medicare Other

## 2015-09-01 DIAGNOSIS — C519 Malignant neoplasm of vulva, unspecified: Secondary | ICD-10-CM | POA: Diagnosis present

## 2015-09-01 LAB — COMPREHENSIVE METABOLIC PANEL (CC13)
ALBUMIN: 3.7 g/dL (ref 3.5–5.0)
ALK PHOS: 63 U/L (ref 40–150)
ALT: 14 U/L (ref 0–55)
ANION GAP: 8 meq/L (ref 3–11)
AST: 23 U/L (ref 5–34)
BUN: 16.2 mg/dL (ref 7.0–26.0)
CALCIUM: 9.5 mg/dL (ref 8.4–10.4)
CO2: 30 mEq/L — ABNORMAL HIGH (ref 22–29)
Chloride: 103 mEq/L (ref 98–109)
Creatinine: 1.1 mg/dL (ref 0.6–1.1)
EGFR: 55 mL/min/{1.73_m2} — AB (ref >90)
Glucose: 105 mg/dl (ref 70–140)
POTASSIUM: 4.6 meq/L (ref 3.5–5.1)
Sodium: 142 mEq/L (ref 136–145)
Total Bilirubin: 0.34 mg/dL (ref 0.20–1.20)
Total Protein: 7.1 g/dL (ref 6.4–8.3)

## 2015-09-01 LAB — MAGNESIUM (CC13): MAGNESIUM: 2.2 mg/dL (ref 1.5–2.5)

## 2015-09-01 LAB — CBC WITH DIFFERENTIAL/PLATELET
BASO%: 0.8 % (ref 0.0–2.0)
BASOS ABS: 0.1 10*3/uL (ref 0.0–0.1)
EOS ABS: 0.2 10*3/uL (ref 0.0–0.5)
EOS%: 2.1 % (ref 0.0–7.0)
HEMATOCRIT: 34.3 % — AB (ref 34.8–46.6)
HEMOGLOBIN: 11.5 g/dL — AB (ref 11.6–15.9)
LYMPH#: 1 10*3/uL (ref 0.9–3.3)
LYMPH%: 12.4 % — ABNORMAL LOW (ref 14.0–49.7)
MCH: 30.1 pg (ref 25.1–34.0)
MCHC: 33.4 g/dL (ref 31.5–36.0)
MCV: 90.2 fL (ref 79.5–101.0)
MONO#: 0.8 10*3/uL (ref 0.1–0.9)
MONO%: 9.9 % (ref 0.0–14.0)
NEUT#: 6 10*3/uL (ref 1.5–6.5)
NEUT%: 74.8 % (ref 38.4–76.8)
Platelets: 323 10*3/uL (ref 145–400)
RBC: 3.8 10*6/uL (ref 3.70–5.45)
RDW: 13.5 % (ref 11.2–14.5)
WBC: 8.1 10*3/uL (ref 3.9–10.3)

## 2015-09-09 ENCOUNTER — Ambulatory Visit: Payer: Medicare Other | Admitting: Oncology

## 2015-09-12 ENCOUNTER — Ambulatory Visit: Payer: Medicare Other | Attending: Gynecologic Oncology | Admitting: Gynecologic Oncology

## 2015-09-12 ENCOUNTER — Telehealth: Payer: Self-pay | Admitting: Oncology

## 2015-09-12 ENCOUNTER — Ambulatory Visit (HOSPITAL_BASED_OUTPATIENT_CLINIC_OR_DEPARTMENT_OTHER): Payer: Medicare Other | Admitting: Oncology

## 2015-09-12 ENCOUNTER — Other Ambulatory Visit: Payer: Self-pay | Admitting: Oncology

## 2015-09-12 ENCOUNTER — Encounter: Payer: Self-pay | Admitting: Gynecologic Oncology

## 2015-09-12 ENCOUNTER — Encounter: Payer: Self-pay | Admitting: Oncology

## 2015-09-12 VITALS — BP 108/76 | HR 95 | Temp 98.3°F | Resp 18 | Ht 63.0 in | Wt 166.2 lb

## 2015-09-12 DIAGNOSIS — I739 Peripheral vascular disease, unspecified: Secondary | ICD-10-CM

## 2015-09-12 DIAGNOSIS — C7800 Secondary malignant neoplasm of unspecified lung: Secondary | ICD-10-CM

## 2015-09-12 DIAGNOSIS — I2510 Atherosclerotic heart disease of native coronary artery without angina pectoris: Secondary | ICD-10-CM

## 2015-09-12 DIAGNOSIS — Z951 Presence of aortocoronary bypass graft: Secondary | ICD-10-CM

## 2015-09-12 DIAGNOSIS — Z9889 Other specified postprocedural states: Secondary | ICD-10-CM

## 2015-09-12 DIAGNOSIS — C775 Secondary and unspecified malignant neoplasm of intrapelvic lymph nodes: Secondary | ICD-10-CM

## 2015-09-12 DIAGNOSIS — C519 Malignant neoplasm of vulva, unspecified: Secondary | ICD-10-CM | POA: Diagnosis present

## 2015-09-12 DIAGNOSIS — E785 Hyperlipidemia, unspecified: Secondary | ICD-10-CM

## 2015-09-12 DIAGNOSIS — Z87891 Personal history of nicotine dependence: Secondary | ICD-10-CM

## 2015-09-12 DIAGNOSIS — T8131XD Disruption of external operation (surgical) wound, not elsewhere classified, subsequent encounter: Secondary | ICD-10-CM

## 2015-09-12 NOTE — Telephone Encounter (Signed)
Appointments made and avs pritned for patient °

## 2015-09-12 NOTE — Progress Notes (Signed)
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   HISTORY OF PRESENT ILLNESS:Amy Mayer is a 69 y.o. female who is seen in consultation, alone for visit, at the request of Dr Denman George, for consideration of chemotherapy for metastatic vulvar carcinoma. History is from [place], [place] records in [place], this EMR and patient, with additional chemotherapy information to be requested. Patient reports significant fluid retention with CDDP requiring IV lasix.   Patient presented to PCP Feb 2016 with increasing right vulvar mass measuring 8 x 4 x 3 cm, biopsy with squamous cell carcinoma. PET had uptake in bilateral inguinal nodes and 3 nonspecific lung nodules. US biopsy lymph node at [facility] 02-04-15 [phone]) documented metastatic squamous cell carcinoma.   She received radiation by Dr Erlene Quan with sensitizing CDDP chemotherapy, the radiation given from 02-17-15 thru 04-19-15, which was 6300 cGy in 35 fractions to all PET avid disease and 4500 cGy in 25 fractions to all areas at risk of harboring microscopic disease including the pelvis. Chemotherapy records are not available at time of this visit. She had one break in therapy due to vulvitis, diarrhea and at least one episode of low potassium during treatment, but no significant nausea. Initial follow up showed good response, however she had ulcerative lesion on  vulva by 07-06-15. Repeat PET  07-04-15 showed residual activity in the vulvar region, resolution of left inguinal nodes, decrease in right inguinal nodes (although some activity remains) and 2 new right external iliac nodes, 3 pulmonary nodules stable with a new 5 mm pulmonary nodule. She was referred from Dr Erlene Quan to Dr Josephina Shih, with consultation on 07-15-15, recommendation for radical resection of right vulvar lesion as palliative procedure, with plan to follow up the external iliac node and possible pulmonary lesions after surgery. She had extended right radical vulvectomy 07-26-15 [facility]; final pathology [id number]) showed G3 poorly differentiated squamous cell carcinoma with negative margins. She had post op follow up by Dr Josephina Shih on 08-05-15, with perineal wound separation then; she subsequently saw Dr Denman George on 9-9, 9-19 and again today. PET at [facility] 08-23-15 had uptake in vulvar area possibly surgical, increase in RUL lung nodule from 5 mm to 7 mm now hypermetabolic with SUV 2.7, new hypermetabolic left inguinal node, increase in hypermetabolic right inguinal node and right obturator node, increase in size (?) or RLL lung nodule with other lung nodules stable, and stable hypermetabolic right external iliac node. Dr Denman George has recommended consideration of CDDP taxol. Patient attended chemotherapy education class prior at [facility] 09-01-15.    No central catheter Flu vaccine given 08-2015  Patient is very comfortable with and very fond of her PCP, whom she has known since 2014. She tells me that she "fired last chemo doctor because I never saw her". Likewise she is very pleased with cardiologist Dr Daine Floras.     REVIEW OF SYSTEMS: Local discomfort since vulvectomy no worse, drainage no worse. Begun on TMP/SMX 09-10-15 by PCP for UTI, also lanced area of tender swelling on perineum. Had sweats prior to start on Bactrim,  improved including no sweats last pm, and no fever or shaking chills. Bowels  moving with formed stool ~ 2x daily. Bladder ok. Appetite ok, has increase fresh fruits and veggies, aware of need for good protein for wound healing, likes peanut butter. Mild swelling inner thighs since surgery and can tell slight swelling right ankle (referral previously to lymphedema PT in [facility], unable to keep that appointment). Usual weight 158-160, gained a good bit after initial chemoRT. Occasional stress HA. Good visual acuity with reading glasses. No difficulty hearing. Uses zyrtec for occasional environmental allergies. Needs some dental work including extraction but no pain now. Thyroid fine at recent check. Denies SOB, cough, hemoptysis. No angina or palpitations, denies any chest pain with the acute MI. Not aware of any changes in breasts. No N/V now and none with previous chemo, did not need antiemetics at home. Bladder ok. Lots of arthritis especially right knee > left, both shoulders, right UE not new. No bleeding. No clots. No peripheral neuropathy. Arthritis symptoms multiple joints "not rheumatoid". Some claustrophobia, does not like our infusion area as well as [place].  Remainder of full 10 point review of systems negative.   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 Per patient, resuscitated multiple times and had emergent stent CABG x4 with LIMA 05-12-13  Surgery for spur on clavicle 1997 Knee surgery 1994 Left carotid endarterectomy 10-18-14; right carotid occlusion.  Dr Dorrene German. No history of TIA or CVA Hyperlipidemia Tonsillectomy age 61 Never mammograms No colonoscopy Flu vaccine given late Sept 2016 Pneumovax given late Sept 2016  CURRENT MEDICATIONS: reviewed as listed now in EMR. Uses zyrtec regularly, discussed claritin for taxol aches, which she would use instead of zyrtec if so. Has oxycodone at home, dislikes how she feels with this but no frank allergic problems; has tramadol available  which she tolerated Dr Ulice Brilliant has given her new prescription for 0.5 mg ativan, which is frequently useful for chemo nausea as well as for anxiety; discussed SL or po and fact that this will make her drowsy. OK to use ativan prior to leaving home for chemo if very anxious then; she has zofran 8 mg and compazine.    SOCIAL HISTORY:  Born in [place], lived in [place] since age 59. Lives in [place] with husband, whom she married in 1995, husband age 39 and drives her to appointments. No children but very close to several adult nieces and nephews in [place] as well as great nieces and nephews ages 1-14. Smoked 1 ppd x 40 years until MI 2014. 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  No advance directives and declined information today.   FAMILY HISTORY:  Father HTN, CAD Mother dementia, arthritis 2 brothers without known medical problems 1 brother died of CHF last fall Nieces and nephews healthy         PHYSICAL EXAM: weight 166 lb, down from 171 on 08-29-15. Height 5'3", BMI 29.5. BP 108/76, HR 95 regular, respirations 18 not labored RA, temp 98.3, O2 sat 97% Alert, fully oriented and cooperative, looks stated age, uncomfortable from perineal wound when seated but easily ambulatory and not in acute discomfort. Good historian, appears angry at times, also a little tearful at times.   HEENT:normal hair pattern. PERRL, not icteric. Oral mucosa moist, upper dentures, partially edentulous lower. Posterior pharynx clear. Neck supple without JVD or thyroid mass. Left carotid endarterectomy scar  RESPIRATORY: lungs with  diminished BS thruout without wheezes or rales, no use of accessory muscles, no dullness to percussion  CARDIAC/ VASCULAR:heart RRR clear heart sounds. No gallop. Peripheral pulses intact and symmetrical  ABDOMEN: bowel sounds present and normally active. Not distended, not tender. No appreciable HSM or mass. Perineum and inguinal regions  with radiation changes, absent hair. Right vulvar surgical wound with clean dry dressing, no surrounding erythema or marked tenderness. Area drained by PCP posterior to surgical wound ~ 1x1 cm, no drainage or fluctuance now.   LYMPH NODES: no cervical, supraclavicular, axillary nodes. No inguinal adenopathy appreciated.   BREASTS:bilaterally without dominant mass, skin or nipple findings of concern. Axillae not remarkable  NEUROLOGIC:speech fluent and appropriate, CN intact. Motor, sensory, cerebellar nonfocal.  Psych as above, mood and affect generally appropriate for situation.  SKIN: weathered, no rash, ecchymosis petechiae  MUSCULOSKELETAL: back not tender. LE without pedal edema. No pitting edema inner thighs, no erythema there.     LABORATORY DATA:  Labs 09-01-15       WBC 3.9 - 10.3 10e3/uL 8.1   NEUT# 1.5 - 6.5 10e3/uL 6.0   HGB 11.6 - 15.9 g/dL 11.5 (L)   HCT 34.8 - 46.6 % 34.3 (L)   Platelets 145 - 400 10e3/uL 323   MCV 79.5 - 101.0 fL 90.2   MCH 25.1 - 34.0 pg 30.1   MCHC 31.5 - 36.0 g/dL 33.4   RBC 3.70 - 5.45 10e6/uL 3.80   RDW 11.2 - 14.5 % 13.5   lymph# 0.9 - 3.3 10e3/uL 1.0   MONO# 0.1 - 0.9 10e3/uL 0.8   Eosinophils Absolute 0.0 - 0.5 10e3/uL 0.2   Basophils Absolute 0.0 - 0.1 10e3/uL 0.1   NEUT% 38.4 - 76.8 % 74.8   LYMPH% 14.0 - 49.7 % 12.4 (L)   MONO% 0.0 - 14.0 % 9.9   EOS% 0.0 - 7.0 % 2.1   BASO% 0.0 - 2.0 % 0.8   Resulting Agency [facility]            13d ago     Sodium 136 - 145 mEq/L 142   Potassium 3.5 - 5.1 mEq/L 4.6   Chloride 98 - 109 mEq/L 103   CO2 22 - 29 mEq/L 30 (H)   Glucose 70 - 140 mg/dl 105   Comments: Glucose reference range is for nonfasting patients. Fasting glucose reference range is 70- 100.   BUN 7.0 - 26.0 mg/dL 16.2   Creatinine 0.6 - 1.1 mg/dL 1.1   Total Bilirubin 0.20 - 1.20 mg/dL 0.34   Alkaline Phosphatase 40 - 150 U/L 63   AST 5 - 34 U/L 23   ALT 0 - 55 U/L 14   Total Protein  6.4 - 8.3 g/dL 7.1   Albumin 3.5 - 5.0 g/dL 3.7   Calcium 8.4 - 10.4 mg/dL 9.5   Anion Gap 3 - 11 mEq/L 8   EGFR >90 ml/min/1.73 m2 55 (L)   Comments: eGFR is calculated using the CKD-EPI Creatinine Equation (2009)   Resulting Agency [facility]          Magnesium 2.2 on 09-01-15      PATHOLOGY: Surgical pathology exam8/16/2016  [place]  Component Name Value Range  Case Report Surgical Pathology Report                         Case: [id number]  Authorizing Provider:  Corey Skains                Collected:           07/26/2015 1236                                    Clarke-Pearson, MD                                                          Ordering Location:     MAIN PERIOP [facility]          Received:            07/26/2015 1243             Pathologist:           Benjaman Kindler, MD                                                      Specimens:   A) - Vulva, anterior vulva                                                                         B) - Vulva, right vulva, stitch at 12                                                   Final Diagnosis FSA: Vulva, anterior, biopsy - Poorly differentiated squamous cell carcinoma  B:  Vulva, right, stitch at 12 o'clock, partial vulvectomy - Clinically recurrent poorly differentiated squamous cell carcinoma, post primary chemoradiation (please see synoptic report and comment)   Synoptic Report VULVA: Excisional Biopsy, Resection VULVA: EXCISIONAL BIOPSY,RESECTION - B  SPECIMEN  Specimen:    Vulva  Procedure:    Partial vulvectomy  Lymph Node Sampling:    Not applicable  Specimen Size:    Greatest dimension (cm): 7.6    Additional Dimension (cm):    5.2    Additional Dimension (cm):    2  Tumor Site:    Right vulva  Tumor Size:    Greatest dimension (cm): 5.2    Additional Dimension (cm):    2.2  Tumor Focality:    Unifocal  TUMOR  Histologic Type:    Squamous cell carcinoma    Squamous  Cell Subtype:    Non-keratinizing  Histologic Grade:    G3: Poorly differentiated  EXTENT  Microscopic Tumor Extension:    Depth of invasion (mm): 15  Tumor Border:    Infiltrating  MARGINS  Status of Margin Involvement by Tumor:    Uninvolved by invasive carcinoma    Distance of Invasive Carcinoma from Closest Margin:  Specify (mm): 2    Specify Margin, if possible:    Specify margin: Green inked 6 to 9 o'clock margin    Status of Margin Involvement by Carcinoma In Situ:    Carcinoma in situ not identified at margin  ACCESSORY FINDINGS  Lymph-Vascular Invasion:    Not identified  LYMPH NODES  Lymph Nodes:    No nodes submitted or found  STAGE (pTNM [FIGO])  TNM Descriptors:    r (recurrent)  Primary Tumor (pT):    pT1b [FIGO IB]: Lesions more than 2 cm in size or any size with stromal invasion more than 1.0 mm, confined to the vulva or perineum  Regional Lymph Nodes (pN):    pNX: Regional lymph nodes cannot be assessed  Distant Metastasis (pM):    Not applicable  ADDITIONAL NON-TUMOR  Additional Pathologic Findings:    Other (specify): Background dense stromal fibrosis, consistent with therapy effect   Comment Due to the poor differentiation of the tumor, immunohistochemical stains were performed to confirm squamous differentiation. Tumor cells are strongly and diffusely positive for p63, CK5/6, and p16, supporting the diagnosis of poorly differentiated squamous cell carcinoma.      [facility] US biopsy right inguinal lymph node 02-04-15  [phone] DIAGNOSIS   LYMPH NODE, RIGHT INGUINAL, BIOPSY:  METASTATIC SQUAMOUS CELL CARCINOMA.  (MNS)   COMMENT  THIS METASTATIC CARCINOMA SHOWS STRONG NUCLEAR POSITIVITY FOR P63,  CONSISTENT WITH SQUAMOUS CELL CARCINOMA.     Rickard Patience MD  PATHOLOGIST  (CASE SIGNED 02/07/2015)   CLINICAL INFORMATION   ENLARGED LYMPH NODE, HISTORY OF VULVAR CANCER.   SPECIMEN  LYMPH NODE, RT. ING.-NEEDLE CORE BIOPSY        RADIOLOGY   PET CT Skull Base To [facility] Initial2/15/2016  [facility]  Result Impression  IMPRESSION: 1. Large hypermetabolic mass in the region of the vulva 2. Hypermetabolic bilateral inguinal nodes, right greater than left 3. There are 3 nonspecific lung nodules measuring up to 4 mm   Result Narrative  INDICATION: C51.9: Malignant neoplasm of vulva, unspecified  TECHNIQUE: 7.9 millicuries of N-46 FDG was administered intravenously. PET imaging was obtained from the skull base to the mid thighs. CT images were obtained for attenuation correction and localization purposes. Glucose level was 92 MG/DL. Time from  injection to scan was 72 minutes. Comparison none.  FINDINGS: Large area of hypermetabolic activity in the region of the vulva bilaterally with SUV Max of 22.8.  Hypermetabolic bilateral inguinal nodes, right greater than left. Right middle node on image 234 measures 33 x 17 mm with an SUV Max of 13.5.  3 mm right upper lobe nodule image 89. 3 mm right upper lobe nodule image 87. 64 mm left upper lobe nodule image 75.      Other Result ReportsCollapse All  2016 September  PET CT Skull Base To Thighs Subsequent9/13/2016  [facility]  Result Impression  IMPRESSION: 1. What may be a surgical defect in the region of the vulva. Surrounding rind of hypermetabolic soft tissue 2. Increase in size of a right upper lobe nodule which is now hypermetabolic. There is also increased in size of the left lower lobe nodule which is not hypermetabolic 3. New hypermetabolic left inguinal node 4. Increase in hypermetabolic right inguinal node 5. Stable hypermetabolic right external iliac node 6. Increase in hypermetabolic right obturator node   Result Narrative  INDICATION: C51.9: Malignant neoplasm of vulva, unspecified  TECHNIQUE: 7.7 millicuries of E-70 FDG was administered intravenously. PET imaging was obtained from  the skull base to the mid thighs. CT images were  obtained for attenuation correction and localization purposes. Glucose level was 103 MG/DL. Time from  injection to scan was 76 minutes. Comparison 07/04/2015.  FINDINGS: There is what may represent a surgical defect in the region of the vulva. There is surrounding hypermetabolic activity with an SUV Max of 5.4.  New hypermetabolic 14 x 8 mm left inguinal node image 234. SUV Max of 7.5.  Increase in hypermetabolic right inguinal node. Currently seen on image 236 measuring 17 x 15 mm compared to 15 x 12 mm. SUV Max of 8.1.  7 mm right upper lobe nodule image 80 previously measured 5 mm. SUV Max is currently 2.7. Stable 3 mm right upper lobe nodule image 88. 3 mm right upper lobe nodule image 86 is stable stable 4 mm left upper lobe nodule image 74. There is a 7 mm left  lower lobe nodule image 119 which could merely be seen on the prior.  Right external iliac lymph node on image 211 currently measures 9 x 10 mm compared to 10 x 9 mm. Current SUV Max is 7.7.  A right obturator node on image 217 measures 13 x 9 mm compared to 8 x 5 mm. SUV Max of 7.0  Gallstones         DISCUSSION:     All of history above reviewed with patient. We have particularly addressed progression on PET from 08-23-15 and consideration for chemotherapy in attempt to improve and control disease, tho this will not be curative. Most likely the PET avid pulmonary nodule and other smaller nodules are related to the vulvar malignancy, tho she also has long past tobacco history and possibly these could be second primary; nodal disease has been documented squamous cell carcinoma and is consistent with the vulvar primary.  Patient is familiar with CDDP, including necessary hydration, from her prior treatment; we have reviewed rationale for premedication steroids with taxol, possible taxol aches, possible peripheral neuropathy from both CDDP and taxol. Discussed heating pad/ ice packs/ claritin/ possibly some help from tramadol if  taxol aches. She may need lasix with CDDP hydration and we will follow K and Mg closely on that agent. She understands that she will be at this office for 6-8 hours with the combination CDDP and taxol, and will need to be seen at least once by provider between treatments. She will have to drive an hour from [place] to this office.  Patient prefers treatment in [place], tho wonders about moving medical oncology care to Dr Marin Olp in [facility] after recovered from recent gyn oncology surgery. See meds above also. Discussed protein in diet to aid wound healing. Patient wants to try chemotherapy, understands that she can stop this at any time if she feels it is more than she wants to continue. Verbal consent obtained.     Case discussed with Dr Denman George after her visit also today. Surgical wound healing is slow as anticipated given prior radiation, and most likely the wound will not be healed by start of chemotherapy. Dr Denman George will follow up the wound in ~ 2 weeks.   IMPRESSION / PLAN:  1.Metastatic squamous cell carcinoma of vulva: local progression following maximal radiation with sensitizing chemotherapy given in [facility] thru 04-19-15, now post right radical vulvectomy at [facility] 07-26-15 and with progressive pulmonary metastatic disease and node involvement.  Wound healing slowly as anticipated, no infection. Will request preauthorization for every 3 week CDDP taxol , to begin ~  10-13 or 10-20. She may need gCSF support.  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 Orthopedic surgeries clavicle and left knee 11.social situation: lives an hour away, 77 yo husband will be driving her to  appointments, which she wants in [place]. 12.flu vaccine done 08-2015 13.pneumonia vaccine done 08-2015  Patient had all questions answered to her satisfaction and is in agreement with plan above. She can contact this office for questions or concerns at any time prior to next scheduled visit. Chemo orders and neulasta entered, managed care notified.  Support staff follow up requested. I will request bed for chemo if possible. Cc Drs Ulice Brilliant, Orson Ape, Hayes, Tia Alert. Time spent  90 min, including >50% discussion and coordination of care.    Adarius Tigges P, MD 09/12/2015 2:19 PM

## 2015-09-12 NOTE — Progress Notes (Signed)
Followup Note: Gyn-Onc   [name] 69 y.o. female  Chief Complaint  Patient presents with  . 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 Perineal wound separation. Postoperative wound infection - healing.  Evidence of systemic disease on postop PET (pelvic lymphadenopathy, inguinal lymphadenopathy, increasing pulmonary nodule).  Plan: I recommend the patient continue a sitz bath twice a day, use the hand-held showerhead to irrigate the perineum, at the area dry, and then continue to apply a wet gauze and to change this as a wet to dry dressing with each toiletting event and evenings and mornings.   Systemic therapy with cisplatin and paclitaxel with Dr Marko Plume. Palliative intent. Recommend starting this soon as I expect her vulvar wound will have delayed healing (more than 2 months) due to her prior radiation. However, it is not infected.  I will see her back in approximately 2 weeks to follow healing.    Interval history: Patient underwent extended right radical vulvectomy at [facility] on 07/26/2015. 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  She has been healing well, and has been packing vulvar wound BID without issues and decreasing pain. She has just been started on an antibiotic for a UTI, and had I&D of a right buttock curuncle by her PCP.  HPI: 69 year old white married female seen in consultation at the request of Dr. Marguerita Merles (radiation oncologist at [facility] [place]) regarding management of recurrent vulvar carcinoma.  The patient initially presented in the spring of 2016 with an enlarging right vulvar mass involving the right vulva measured approximate 8 x 4 x 3 cm. Biopsy revealed squamous cell carcinoma. Patient underwent a PET scan showing uptake in bilateral inguinal nodes and 3 nonspecific lung  nodules. She received definitive chemoradiation therapy. Radiation course extended between 02/17/2015 and 04/19/2015. She received a total of 6300 cGy in 35 fractions to all PET avid disease and 4500 cGy in 25 fractions to all areas at risk of harboring microscopic disease including the pelvis. She had one break in therapy due to vulvitis. Initial follow-up examination showed complete response although on July 27 Dr. Erlene Quan found a ulcerative lesion consistent with recurrent disease. A subsequent PET scan showed some residual activity in the vulvar region, resolution of left inguinal nodes, decrease in right inguinal nodes (although some activity remains) and 2 new right external iliac nodes. The 3 pulmonary nodules are stable although there is a new 5 mm pulmonary nodule as well.  She underwent extended radical vulvectomy of the right vulvar lesion on 07/26/2015 at [facility]. All surgical margins were negative.  She developed a postoperative wound separation and cellulitis which was debrided and then treated with wet to dry dressings.   She saw her Radiation Oncologist, Dr Erlene Quan, in [place] in September, 2016 and reviewed her PET from [facility].  On 08/23/15 it demonstrated a new 14 x 8 mm left inguinal lymph node that was FDG avid. An increase in hypermetabolic right inguinal node measuring 17 x 15 mm that was FDG avid. An increase in the size of a right upper lobe lung nodule now measuring 7 mm (from 5 mm) that was FDG avid. Scattered additional small lung nodules were stable in size. There was right external iliac lymph node measuring 10 mm that was FDG avid. A right obturator lymph node measured 13 mm and was also FDG appendectomy. There was surrounding  hypermetabolic activity in the region of the vulva.  Review of Systems:10 point review of systems is negative except as noted in interval history.   Vitals: Blood pressure 108/76, pulse 95, temperature 98.3 F (36.8 C), temperature source  Oral, resp. rate 18, height 5\' 3"  (1.6 m), weight 166 lb 3.2 oz (75.388 kg), SpO2 97 %.  Physical Exam: General : The patient is a healthy woman in no acute distress.  HEENT: normocephalic, extraoccular movements normal; neck is supple without thyromegally  Lynphnodes: Supraclavicular and inguinal nodes not enlarged . There is thickening in the inguinal regions secondary to recent radiation therapy and "tanning" of the skin. Abdomen: Soft, non-tender, no ascites, no organomegally, no masses, no hernias  Pelvic:  EGBUS:  Status post radical vulvectomy. The advancement flaps seen to be healing well at the present time. However in the midline perineum there is separation of the wound. Large 5cm crater at perineum with granulation tissue. Healing well. +tissue to debride in right proximal /vaginal aspect. Dressing replaced.  Urethra and Bladder: Normal, non-tender  Cervix: Unable to visualize. Uterus: Could not evaluate.   Rectal: normal sphincter tone, no masses, no blood , the anus and rectum are free from the ulcerative lesion. Lower extremities: Mild lymphedema in the upper thighs.     Allergies  Allergen Reactions  . Aspirin Other (See Comments)    "Only uncoated" makes stomach upset  . Codeine Nausea And Vomiting  . Penicillins Hives  . Morphine Anxiety    Past Medical History  Diagnosis Date  . Arthritis   . Hypertension   . Myocardial infarction ([place]) 04/2013  . Hyperlipidemia   . Coronary artery disease   . Cataracts, bilateral   . Carotid stenosis     Past Surgical History  Procedure Laterality Date  . Tonsillectomy  1964  . Aortic valve replacement (avr)/coronary artery bypass grafting (cabg)  2014  . Carotid endarterectomy  10/18/2014  . Clavicle surgery  1997  . Cardiac catheterization      Current Outpatient Prescriptions  Medication Sig Dispense Refill  . aspirin EC 81 MG tablet Take 81 mg by mouth daily.     . cetirizine (ZYRTEC) 10 MG tablet Take 10 mg by  mouth as needed.     . Cholecalciferol (VITAMIN D-3) 1000 UNITS CAPS Take 1,000 Units by mouth daily.     Marland Kitchen LORazepam (ATIVAN) 0.5 MG tablet Take 0.25-0.5 mg by mouth every 6 (six) hours as needed.    . Magnesium 250 MG TABS Take 2 tablets by mouth daily.     . Multiple Vitamin (THERA) TABS Take 1 tablet by mouth daily.     . mupirocin ointment (BACTROBAN) 2 % Apply 1 application topically daily.    . Omega-3 Fatty Acids (FISH OIL) 1000 MG CAPS Take 2 capsules by mouth daily.     . Potassium Gluconate 2 MEQ TABS Take 400 mg by mouth daily.     . pravastatin (PRAVACHOL) 80 MG tablet Take 80 mg by mouth daily.     . silver sulfADIAZINE (SILVADENE) 1 % cream Apply topically daily as needed.     . sulfamethoxazole-trimethoprim (BACTRIM DS,SEPTRA DS) 800-160 MG tablet Take 1 tablet by mouth 2 (two) times daily.    . vitamin C (ASCORBIC ACID) 500 MG tablet Take 500 mg by mouth daily.    . ondansetron (ZOFRAN) 8 MG tablet Take by mouth every 8 (eight) hours as needed for nausea or vomiting.    . prochlorperazine (COMPAZINE) 10 MG  tablet Take 10 mg by mouth as needed.     . traMADol (ULTRAM) 50 MG tablet Take 1-2 tablets (50-100 mg total) by mouth every 6 (six) hours as needed. (Patient not taking: Reported on 09/12/2015) 30 tablet 0   No current facility-administered medications for this visit.    Social History   Social History  . Marital Status: Married    Spouse Name: N/A  . Number of Children: N/A  . Years of Education: N/A   Occupational History  . Not on file.   Social History Main Topics  . Smoking status: Former Smoker    Quit date: 05/08/2013  . Smokeless tobacco: Not on file  . Alcohol Use: Yes     Comment: wine - several times a week  . Drug Use: No  . Sexual Activity: No   Other Topics Concern  . Not on file   Social History Narrative    Family History  Problem Relation Age of Onset  . Hypertension Father   . Coronary artery disease Father   . Alcoholism Father    . Diabetes Maternal Aunt   . Arthritis Mother   . Heart disease Brother    PROCEDURE NOTE: DEBRIDEMENT OF VULVA Sharp scissors and tweezers were utilized to sharply debride a 2cm2 area of necrotic tissue at the right vulvar wound (deep). It was debrided to fresh bleeding granulation tissue and tolerated well. The wound was repacked with moistened guaze.   Donaciano Eva, MD 09/12/2015, 1:29 PM

## 2015-09-12 NOTE — Patient Instructions (Signed)
Please followup with Dr. Denman George when you are are scheduled to see Dr. Marko Plume. As discussed with Dr. Denman George, it is ok to move forward with chemotherapy.

## 2015-09-14 ENCOUNTER — Encounter: Payer: Self-pay | Admitting: Pharmacist

## 2015-09-14 ENCOUNTER — Other Ambulatory Visit: Payer: Self-pay | Admitting: Oncology

## 2015-09-14 DIAGNOSIS — E785 Hyperlipidemia, unspecified: Secondary | ICD-10-CM | POA: Insufficient documentation

## 2015-09-14 DIAGNOSIS — Z951 Presence of aortocoronary bypass graft: Secondary | ICD-10-CM | POA: Insufficient documentation

## 2015-09-14 DIAGNOSIS — T8130XA Disruption of wound, unspecified, initial encounter: Secondary | ICD-10-CM | POA: Insufficient documentation

## 2015-09-14 DIAGNOSIS — C775 Secondary and unspecified malignant neoplasm of intrapelvic lymph nodes: Secondary | ICD-10-CM | POA: Insufficient documentation

## 2015-09-14 DIAGNOSIS — C7800 Secondary malignant neoplasm of unspecified lung: Secondary | ICD-10-CM | POA: Insufficient documentation

## 2015-09-14 DIAGNOSIS — I739 Peripheral vascular disease, unspecified: Secondary | ICD-10-CM | POA: Insufficient documentation

## 2015-09-14 DIAGNOSIS — Z87891 Personal history of nicotine dependence: Secondary | ICD-10-CM | POA: Insufficient documentation

## 2015-09-14 DIAGNOSIS — Z9889 Other specified postprocedural states: Secondary | ICD-10-CM | POA: Insufficient documentation

## 2015-09-14 DIAGNOSIS — C519 Malignant neoplasm of vulva, unspecified: Secondary | ICD-10-CM | POA: Insufficient documentation

## 2015-09-14 DIAGNOSIS — I2510 Atherosclerotic heart disease of native coronary artery without angina pectoris: Secondary | ICD-10-CM | POA: Insufficient documentation

## 2015-09-14 MED ORDER — DEXAMETHASONE 4 MG PO TABS: ORAL_TABLET | ORAL | Status: DC

## 2015-09-15 ENCOUNTER — Telehealth: Payer: Self-pay | Admitting: *Deleted

## 2015-09-15 MED ORDER — DEXAMETHASONE 4 MG PO TABS: ORAL_TABLET | ORAL | Status: DC

## 2015-09-15 NOTE — Telephone Encounter (Signed)
I have called and left a message that we changed her appts on 10/20.

## 2015-09-16 ENCOUNTER — Encounter: Payer: Self-pay | Admitting: Oncology

## 2015-09-16 NOTE — Progress Notes (Signed)
Called pt to introduce myself as Financial Advocate. Asked pt if she had any financial questions or concerns. Pt states not at this time. Asked pt if she knows if she has met her deductible/oop for the year. Pt states she has met OOP and has no deductible. Pt advised to contact me should she have any questions or concerns.     °

## 2015-09-22 ENCOUNTER — Encounter: Payer: Self-pay | Admitting: General Practice

## 2015-09-22 NOTE — Progress Notes (Signed)
Spiritual Care Note  Reached out to pt by phone per referral from Dr Marko Plume.  Introduced [place] as part of Ms Hopfer's support team.  Through the conversation, she warmed and verbalized gratitude for support.  Provided opportunity for her to process her series of health stressors in the last 2.5 years.  Per pt, her faith is "really what is getting me through all of this; I don't think I'd make it without it."  She understands her health issues as a significant part of increasing her faith.  She reports good support from her husband and church, taking great comfort in how many people are praying for her.  We plan to meet in person during her first chemo on 10/20, and she is aware that she may request that the chaplain be paged anytime she is on campus, as well.    [place], [place], [facility] Pager [phone] Voicemail  [phone]

## 2015-09-26 ENCOUNTER — Other Ambulatory Visit: Payer: Medicare Other

## 2015-09-26 ENCOUNTER — Encounter: Payer: Self-pay | Admitting: Gynecologic Oncology

## 2015-09-26 ENCOUNTER — Ambulatory Visit: Payer: Medicare Other | Attending: Gynecologic Oncology | Admitting: Gynecologic Oncology

## 2015-09-26 VITALS — BP 132/68 | HR 85 | Temp 97.7°F | Resp 18 | Ht 63.0 in | Wt 166.1 lb

## 2015-09-26 DIAGNOSIS — N3000 Acute cystitis without hematuria: Secondary | ICD-10-CM

## 2015-09-26 DIAGNOSIS — C519 Malignant neoplasm of vulva, unspecified: Secondary | ICD-10-CM | POA: Insufficient documentation

## 2015-09-26 DIAGNOSIS — R102 Pelvic and perineal pain: Secondary | ICD-10-CM | POA: Diagnosis not present

## 2015-09-26 MED ORDER — TRAMADOL HCL 50 MG PO TABS: 50.0000 mg | ORAL_TABLET | Freq: Four times a day (QID) | ORAL | Status: DC | PRN

## 2015-09-26 NOTE — Patient Instructions (Signed)
Plan to proceed with chemotherapy under the care of Dr. Marko Plume.  We will call you with the results of your urine culture.  Follow up in two to four weeks with Dr. Denman George.  Please call once you have your chemo schedule so we can plan for an appointment to see Dr. Denman George.

## 2015-09-26 NOTE — Addendum Note (Signed)
Addended by: Joylene John D on: 09/26/2015 02:36 PM   Modules accepted: Orders

## 2015-09-26 NOTE — Progress Notes (Signed)
Followup Note: Amy Mayer   Amy Mayer 69 y.o. female  Chief Complaint  Mayer presents with  . Vulvar Cancer    follow-up    Assessment :   Recurrent carcinoma of Amy vulva (right) status post radical vulvectomy on 07/26/2015. Perineal wound separation. Postoperative wound infection - healing.  Evidence of systemic disease on postop PET (pelvic lymphadenopathy, inguinal lymphadenopathy, increasing pulmonary nodule).  Plan: I recommend Amy Mayer continue a sitz bath twice a day, use Amy hand-held showerhead to irrigate Amy perineum, at Amy area dry, and then continue to apply a wet gauze and to change this as a wet to dry dressing with each toiletting event and evenings and mornings.   Systemic therapy with cisplatin and paclitaxel with Dr Marko Plume. Palliative intent. Recommend starting this soon as I expect Amy Mayer vulvar wound will have delayed healing (more than 2 months) due to Amy Mayer prior radiation. However, it is not infected.  Amy Mayer is scheduled to start this on Thursday.  Cystitis- uncomplicated, on Levaquin. Test of cure today. Amy Mayer feels generally unwell. Will want to ensure that this is controlled prior to starting chemotherapy.  I will see Amy Mayer back in approximately 2-4 weeks to follow healing.  Interval history: Mayer underwent extended right radical vulvectomy at [facility] on 07/26/2015. 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  Amy Mayer has been healing well, and has been packing vulvar wound BID without issues and decreasing pain. Amy Mayer had been treated with an antibiotic (levaquin) for a UTI, but test of cure showed persistence, and so Amy dose was increased to 750 q day 6 days ago. Amy Mayer still feels generally unwell.  HPI: 69 year old white married female seen in consultation at Amy request of Dr. Marguerita Merles (radiation oncologist at [facility] [place]) regarding management of recurrent vulvar  carcinoma.  Amy Mayer initially presented in Amy spring of 2016 with an enlarging right vulvar mass involving Amy right vulva measured approximate 8 x 4 x 3 cm. Biopsy revealed squamous cell carcinoma. Mayer underwent a PET scan showing uptake in bilateral inguinal nodes and 3 nonspecific lung nodules. Amy Mayer received definitive chemoradiation therapy. Radiation course extended between 02/17/2015 and 04/19/2015. Amy Mayer received a total of 6300 cGy in 35 fractions to all PET avid disease and 4500 cGy in 25 fractions to all areas at risk of harboring microscopic disease including Amy pelvis. Amy Mayer had one break in therapy due to vulvitis. Initial follow-up examination showed complete response although on July 27 Dr. Erlene Quan found a ulcerative lesion consistent with recurrent disease. A subsequent PET scan showed some residual activity in Amy vulvar region, resolution of left inguinal nodes, decrease in right inguinal nodes (although some activity remains) and 2 new right external iliac nodes. Amy 3 pulmonary nodules are stable although there is a new 5 mm pulmonary nodule as well.  Amy Mayer underwent extended radical vulvectomy of Amy right vulvar lesion on 07/26/2015 at [facility]. All surgical margins were negative.  Amy Mayer developed a postoperative wound separation and cellulitis which was debrided and then treated with wet to dry dressings.   Amy Mayer saw Amy Mayer Radiation Oncologist, Dr Erlene Quan, in [place] in September, 2016 and reviewed Amy Mayer PET from [facility].  On 08/23/15 it demonstrated a new 14 x 8 mm left inguinal lymph node that was FDG avid. An increase in hypermetabolic right inguinal node measuring 17 x 15 mm that was FDG avid. An increase in Amy size of a right upper lobe lung nodule now  measuring 7 mm (from 5 mm) that was FDG avid. Scattered additional small lung nodules were stable in size. There was right external iliac lymph node measuring 10 mm that was FDG avid. A right obturator lymph node measured 13 mm  and was also FDG appendectomy. There was surrounding hypermetabolic activity in Amy region of Amy vulva.  Review of Systems:10 point review of systems is negative except as noted in interval history. Except for general malaise.  Vitals: Blood pressure 132/68, pulse 85, temperature 97.7 F (36.5 C), temperature source Oral, resp. rate 18, height 5\' 3"  (1.6 m), weight 166 lb 1.6 oz (75.342 kg).  Physical Exam: General : Amy Mayer is a healthy woman in no acute distress.  HEENT: normocephalic, extraoccular movements normal; neck is supple without thyromegally  Lynphnodes: Supraclavicular and inguinal nodes not enlarged . There is thickening in Amy inguinal regions secondary to recent radiation therapy and "tanning" of Amy skin. Abdomen: Soft, non-tender, no ascites, no organomegally, no masses, no hernias  Pelvic:  EGBUS:  Status post radical vulvectomy. Amy advancement flaps seen to be healing well at Amy present time. However in Amy midline perineum there is separation of Amy wound. Large 5cm crater at perineum with granulation tissue. Healing well. No debriding necessary. Dressing replaced.  Urethra and Bladder: Normal, non-tender  Cervix: Unable to visualize. Uterus: Could not evaluate.   Rectal: normal sphincter tone, no masses, no blood , Amy anus and rectum are free from Amy ulcerative lesion. Lower extremities: Mild lymphedema in Amy upper thighs.     Allergies  Allergen Reactions  . Aspirin Other (See Comments)    "Only uncoated" makes stomach upset  . Codeine Nausea And Vomiting  . Penicillins Hives  . Morphine Anxiety    Past Medical History  Diagnosis Date  . Arthritis   . Hypertension   . Myocardial infarction ([place]) 04/2013  . Hyperlipidemia   . Coronary artery disease   . Cataracts, bilateral   . Carotid stenosis     Past Surgical History  Procedure Laterality Date  . Tonsillectomy  1964  . Aortic valve replacement (avr)/coronary artery bypass grafting  (cabg)  2014  . Carotid endarterectomy  10/18/2014  . Clavicle surgery  1997  . Cardiac catheterization      Current Outpatient Prescriptions  Medication Sig Dispense Refill  . aspirin EC 81 MG tablet Take 81 mg by mouth daily.     . cetirizine (ZYRTEC) 10 MG tablet Take 10 mg by mouth as needed.     . Cholecalciferol (VITAMIN D-3) 1000 UNITS CAPS Take 1,000 Units by mouth daily.     Marland Kitchen dexamethasone (DECADRON) 4 MG tablet Take 5 tablets =20 mg  with food 12 hrs and 6 hrs prior to Taxol. 10 tablet 0  . levofloxacin (LEVAQUIN) 750 MG tablet Take 750 mg by mouth daily.     Marland Kitchen LORazepam (ATIVAN) 0.5 MG tablet Take 0.25-0.5 mg by mouth every 6 (six) hours as needed.    . Magnesium 250 MG TABS Take 2 tablets by mouth daily.     . Multiple Vitamin (THERA) TABS Take 1 tablet by mouth daily.     . mupirocin ointment (BACTROBAN) 2 % Apply 1 application topically daily.    . Omega-3 Fatty Acids (FISH OIL) 1000 MG CAPS Take 2 capsules by mouth daily.     . ondansetron (ZOFRAN) 8 MG tablet Take by mouth every 8 (eight) hours as needed for nausea or vomiting.    . Potassium Gluconate 2  MEQ TABS Take 400 mg by mouth daily.     . pravastatin (PRAVACHOL) 80 MG tablet Take 80 mg by mouth daily.     . prochlorperazine (COMPAZINE) 10 MG tablet Take 10 mg by mouth as needed.     . silver sulfADIAZINE (SILVADENE) 1 % cream Apply topically daily as needed.     . traMADol (ULTRAM) 50 MG tablet Take 1-2 tablets (50-100 mg total) by mouth every 6 (six) hours as needed. 30 tablet 0  . vitamin C (ASCORBIC ACID) 500 MG tablet Take 500 mg by mouth daily.     No current facility-administered medications for this visit.    Social History   Social History  . Marital Status: Married    Spouse Name: N/A  . Number of Children: N/A  . Years of Education: N/A   Occupational History  . Not on file.   Social History Main Topics  . Smoking status: Former Smoker -- 1.00 packs/day for 40 years    Quit date: 05/08/2013   . Smokeless tobacco: Not on file  . Alcohol Use: Yes     Comment: wine - several times a week  . Drug Use: No  . Sexual Activity: No   Other Topics Concern  . Not on file   Social History Narrative    Family History  Problem Relation Age of Onset  . Hypertension Father   . Coronary artery disease Father   . Alcoholism Father   . Diabetes Maternal Aunt   . Arthritis Mother   . Heart disease Brother       Donaciano Eva, MD 09/26/2015, 2:21 PM

## 2015-09-27 ENCOUNTER — Other Ambulatory Visit: Payer: Self-pay | Admitting: Oncology

## 2015-09-27 ENCOUNTER — Telehealth: Payer: Self-pay

## 2015-09-27 DIAGNOSIS — C775 Secondary and unspecified malignant neoplasm of intrapelvic lymph nodes: Secondary | ICD-10-CM

## 2015-09-27 DIAGNOSIS — C519 Malignant neoplasm of vulva, unspecified: Secondary | ICD-10-CM

## 2015-09-27 LAB — URINE CULTURE

## 2015-09-27 NOTE — Telephone Encounter (Signed)
Reviewed information as noted below by Dr. Marko Plume.  Patient verbalized understanding and wrote down instructions for pre hydration of CDDP. Drink five eight ounce glasses of water the evening prior to the treatment and the morning prior to coming to the office. Pt concerned with packed wound near vulva the the urinary frequency would be difficult to manageas she has to repack wound after each urination.  Discussed concern of packing with Joylene John NP with [place].  She stated that it would be fine for Amy Mayer to use the peri bottle and flush out the wound after each urination while frequent andrepack wound when urination decreases to her normal frequency that evening when she is back home.  Pt will be coming from [facility].  Pt appreciative of the suggestion as this will decrease the amount of time she will need to be in the bathroom.

## 2015-09-27 NOTE — Telephone Encounter (Signed)
Chemotherapy is 09-28-14 at 0830. L/M for Amy Mayer to call back to review the information noted below.

## 2015-09-27 NOTE — Telephone Encounter (Signed)
-----   Message from Gordy Levan, MD sent at 09/14/2015  2:14 PM EDT ----- RN please speak with her shortly prior to first CDDP taxol, which may be 10-13. Review CDDP hydration, premed decadron for taxol, ok to take 1/2 - 1 of the 0.5 mg ativan prior to coming to office.  Remind her of possible taxol aches, which can include sternum, arms, back etc. She has cardiac history so if bad chest pain will still need to be checked for heart, but may be from taxol if so.  Remind that claritin starting day of chemo may help aches.   Needs decadron if not already sent, 20 mg with food 12 hrs and 6 hrs prior to taxol #10 for first time.  Thank you

## 2015-09-28 ENCOUNTER — Telehealth: Payer: Self-pay

## 2015-09-28 ENCOUNTER — Telehealth: Payer: Self-pay | Admitting: Oncology

## 2015-09-28 NOTE — Telephone Encounter (Signed)
Incoming call ,patient requesting results form U/A obtained on 09/26/15 . Updated with results being negative , patient states understanding ,denies further questions

## 2015-09-28 NOTE — Telephone Encounter (Signed)
Appointments made and patient will get a new avs at 10/20 appointment

## 2015-09-29 ENCOUNTER — Ambulatory Visit (HOSPITAL_BASED_OUTPATIENT_CLINIC_OR_DEPARTMENT_OTHER): Payer: Medicare Other

## 2015-09-29 ENCOUNTER — Encounter: Payer: Self-pay | Admitting: Oncology

## 2015-09-29 ENCOUNTER — Ambulatory Visit: Payer: Medicare Other | Admitting: Nutrition

## 2015-09-29 ENCOUNTER — Ambulatory Visit (HOSPITAL_BASED_OUTPATIENT_CLINIC_OR_DEPARTMENT_OTHER): Payer: Medicare Other | Admitting: Oncology

## 2015-09-29 ENCOUNTER — Encounter: Payer: Self-pay | Admitting: General Practice

## 2015-09-29 ENCOUNTER — Other Ambulatory Visit (HOSPITAL_BASED_OUTPATIENT_CLINIC_OR_DEPARTMENT_OTHER): Payer: Medicare Other

## 2015-09-29 VITALS — BP 99/51 | HR 81 | Temp 97.0°F | Resp 18 | Ht 63.0 in

## 2015-09-29 DIAGNOSIS — C775 Secondary and unspecified malignant neoplasm of intrapelvic lymph nodes: Secondary | ICD-10-CM

## 2015-09-29 DIAGNOSIS — C519 Malignant neoplasm of vulva, unspecified: Secondary | ICD-10-CM | POA: Diagnosis present

## 2015-09-29 DIAGNOSIS — Z5111 Encounter for antineoplastic chemotherapy: Secondary | ICD-10-CM | POA: Diagnosis present

## 2015-09-29 DIAGNOSIS — C7800 Secondary malignant neoplasm of unspecified lung: Secondary | ICD-10-CM

## 2015-09-29 DIAGNOSIS — I2510 Atherosclerotic heart disease of native coronary artery without angina pectoris: Secondary | ICD-10-CM

## 2015-09-29 DIAGNOSIS — T8131XD Disruption of external operation (surgical) wound, not elsewhere classified, subsequent encounter: Secondary | ICD-10-CM

## 2015-09-29 LAB — CBC WITH DIFFERENTIAL/PLATELET
BASO%: 0.2 % (ref 0.0–2.0)
BASOS ABS: 0 10*3/uL (ref 0.0–0.1)
EOS ABS: 0 10*3/uL (ref 0.0–0.5)
EOS%: 0.1 % (ref 0.0–7.0)
HEMATOCRIT: 33.8 % — AB (ref 34.8–46.6)
HEMOGLOBIN: 11.2 g/dL — AB (ref 11.6–15.9)
LYMPH#: 0.5 10*3/uL — AB (ref 0.9–3.3)
LYMPH%: 11.1 % — ABNORMAL LOW (ref 14.0–49.7)
MCH: 29 pg (ref 25.1–34.0)
MCHC: 33.2 g/dL (ref 31.5–36.0)
MCV: 87.4 fL (ref 79.5–101.0)
MONO#: 0.1 10*3/uL (ref 0.1–0.9)
MONO%: 1.5 % (ref 0.0–14.0)
NEUT%: 87.1 % — ABNORMAL HIGH (ref 38.4–76.8)
NEUTROS ABS: 3.8 10*3/uL (ref 1.5–6.5)
Platelets: 254 10*3/uL (ref 145–400)
RBC: 3.87 10*6/uL (ref 3.70–5.45)
RDW: 14.3 % (ref 11.2–14.5)
WBC: 4.3 10*3/uL (ref 3.9–10.3)

## 2015-09-29 LAB — COMPREHENSIVE METABOLIC PANEL (CC13)
ALBUMIN: 3.5 g/dL (ref 3.5–5.0)
ALK PHOS: 51 U/L (ref 40–150)
ALT: 17 U/L (ref 0–55)
AST: 29 U/L (ref 5–34)
Anion Gap: 7 mEq/L (ref 3–11)
BILIRUBIN TOTAL: 0.33 mg/dL (ref 0.20–1.20)
BUN: 14.7 mg/dL (ref 7.0–26.0)
CALCIUM: 9.4 mg/dL (ref 8.4–10.4)
CO2: 26 mEq/L (ref 22–29)
Chloride: 105 mEq/L (ref 98–109)
Creatinine: 0.8 mg/dL (ref 0.6–1.1)
EGFR: 82 mL/min/{1.73_m2} — ABNORMAL LOW (ref >90)
GLUCOSE: 159 mg/dL — AB (ref 70–140)
POTASSIUM: 4.5 meq/L (ref 3.5–5.1)
Sodium: 138 mEq/L (ref 136–145)
TOTAL PROTEIN: 6.9 g/dL (ref 6.4–8.3)

## 2015-09-29 MED ORDER — SODIUM CHLORIDE 0.9 % IV SOLN
Freq: Once | INTRAVENOUS | Status: AC
  Administered 2015-09-29: 09:00:00 via INTRAVENOUS

## 2015-09-29 MED ORDER — PALONOSETRON HCL INJECTION 0.25 MG/5ML
0.2500 mg | Freq: Once | INTRAVENOUS | Status: AC
  Administered 2015-09-29: 0.25 mg via INTRAVENOUS

## 2015-09-29 MED ORDER — DIPHENHYDRAMINE HCL 50 MG/ML IJ SOLN
50.0000 mg | Freq: Once | INTRAMUSCULAR | Status: AC
Start: 2015-09-29 — End: 2015-09-29
  Administered 2015-09-29: 50 mg via INTRAVENOUS

## 2015-09-29 MED ORDER — DIPHENHYDRAMINE HCL 50 MG/ML IJ SOLN
INTRAMUSCULAR | Status: AC
  Filled 2015-09-29: qty 1

## 2015-09-29 MED ORDER — POTASSIUM CHLORIDE 2 MEQ/ML IV SOLN
Freq: Once | INTRAVENOUS | Status: AC
  Administered 2015-09-29: 09:00:00 via INTRAVENOUS
  Filled 2015-09-29: qty 10

## 2015-09-29 MED ORDER — PALONOSETRON HCL INJECTION 0.25 MG/5ML
INTRAVENOUS | Status: AC
  Filled 2015-09-29: qty 5

## 2015-09-29 MED ORDER — SODIUM CHLORIDE 0.9 % IV SOLN
Freq: Once | INTRAVENOUS | Status: AC
  Administered 2015-09-29: 11:00:00 via INTRAVENOUS
  Filled 2015-09-29: qty 5

## 2015-09-29 MED ORDER — FAMOTIDINE IN NACL 20-0.9 MG/50ML-% IV SOLN
20.0000 mg | Freq: Two times a day (BID) | INTRAVENOUS | Status: DC
  Administered 2015-09-29: 20 mg via INTRAVENOUS

## 2015-09-29 MED ORDER — FAMOTIDINE IN NACL 20-0.9 MG/50ML-% IV SOLN
INTRAVENOUS | Status: AC
  Filled 2015-09-29: qty 50

## 2015-09-29 MED ORDER — DEXTROSE 5 % IV SOLN
135.0000 mg/m2 | Freq: Once | INTRAVENOUS | Status: AC
  Administered 2015-09-29: 246 mg via INTRAVENOUS
  Filled 2015-09-29: qty 41

## 2015-09-29 MED ORDER — SODIUM CHLORIDE 0.9 % IV SOLN
50.0000 mg/m2 | Freq: Once | INTRAVENOUS | Status: AC
  Administered 2015-09-29: 92 mg via INTRAVENOUS
  Filled 2015-09-29: qty 92

## 2015-09-29 NOTE — Progress Notes (Signed)
Pt voided 700 cc prior to d/c

## 2015-09-29 NOTE — Patient Instructions (Signed)
Amy Mayer Discharge Instructions for Patients Receiving Chemotherapy  Today you received the following chemotherapy agents Taxol and Cisplatin  To help prevent nausea and vomiting after your treatment, we encourage you to take your nausea medication Compazine 10 mg every 6 hours as needed  If you develop nausea and vomiting that is not controlled by your nausea medication, call the clinic.   BELOW ARE SYMPTOMS THAT SHOULD BE REPORTED IMMEDIATELY:  *FEVER GREATER THAN 100.5 F  *CHILLS WITH OR WITHOUT FEVER  NAUSEA AND VOMITING THAT IS NOT CONTROLLED WITH YOUR NAUSEA MEDICATION  *UNUSUAL SHORTNESS OF BREATH  *UNUSUAL BRUISING OR BLEEDING  TENDERNESS IN MOUTH AND THROAT WITH OR WITHOUT PRESENCE OF ULCERS  *URINARY PROBLEMS  *BOWEL PROBLEMS  UNUSUAL RASH Items with * indicate a potential emergency and should be followed up as soon as possible.  Feel free to call the clinic you have any questions or concerns. The clinic phone number is [phone].  Please show the [facility] at check-in to the Emergency Department and triage nurse.
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
## 2015-09-29 NOTE — Progress Notes (Signed)
OFFICE PROGRESS NOTE   September 29, 2015   Physicians:D.ClarkePearson/ E.Emmit Alexanders (PCP [facility], [place] [phone]), Marguerita Merles (radiation oncology [facility]), Roslynn Amble [facility] cardiology [place] [phone] fax [phone]), Dorrene German (vascular surgery Mikel Cella) ([place] gyn oncology [facility]) Adonis Housekeeper cardiovascular surgery Mikel Cella)  INTERVAL HISTORY:  Patient is seen in infusion area, beginning taxol CDDP today for progressive metastatic vulvar carcinoma. She had right radical vulvectomy at [facility] 07-26-15 for local control, then progression of bilateral inguinal nodes and pulmonary met by 08-23-15.  She saw Dr Denman George on 09-26-15 and will see her again in next few weeks to follow wound healing.  Patient did oral prehydration for CDDP as instructed and has voided well since arrival at [facility] today. She denies SOB or any chest discomfort now. As she needed additional lasix with CDDP given at another facility, we will continue strict I/O and add lasix later today if needed. RN aware. Patient will leave packing out of vulvar wound while voiding so much with this hydration, cleansing with peri bottle after each void now; she will repack after voiding has decreased.   Patient reports that she has felt better overall today, likely with premedication decadron for taxol. She had no specific problems over last few days at home, just poor energy/ general fatigue. She has seen PCP since my first visit. Follow up urine culture 10-17 had no infection. She has had no fever. Bowels are moving. She has had more local discomfort in right groin, which she feels is related to progressive right inguinal adenopathy.   Flu vaccine 09-05-15 No central catheter  ONCOLOGIC HISTORY Patient presented to PCP Feb 2016 with increasing right vulvar mass measuring 8 x 4 x 3 cm, biopsy with squamous cell carcinoma. PET had uptake in bilateral inguinal  nodes and 3 nonspecific lung nodules. US biopsy lymph node at [facility] 02-04-15 [id number]) documented metastatic squamous cell carcinoma. She received radiation by Dr Erlene Quan with sensitizing CDDP chemotherapy, the radiation given from 02-17-15 thru 04-19-15, which was 6300 cGy in 35 fractions to all PET avid disease and 4500 cGy in 25 fractions to all areas at risk of harboring microscopic disease including the pelvis. Chemotherapy records are not available at time of this visit. She had one break in therapy due to vulvitis, diarrhea and at least one episode of low potassium during treatment, but no significant nausea. Initial follow up showed good response, however she had ulcerative lesion on vulva by 07-06-15. Repeat PET 07-04-15 showed residual activity in the vulvar region, resolution of left inguinal nodes, decrease in right inguinal nodes (although some activity remains) and 2 new right external iliac nodes, 3 pulmonary nodules stable with a new 5 mm pulmonary nodule. She was referred from Dr Erlene Quan to Dr Josephina Shih, with consultation on 07-15-15, recommendation for radical resection of right vulvar lesion as palliative procedure, with plan to follow up the external iliac node and possible pulmonary lesions after surgery. She had extended right radical vulvectomy 07-26-15 at [facility]; final pathology [id number]) showed G3 poorly differentiated squamous cell carcinoma with negative margins. She had post op follow up by Dr Josephina Shih on 08-05-15, with perineal wound separation then; she subsequently saw Dr Denman George on 9-9, 9-19 and again today. PET at [facility] 08-23-15 had uptake in vulvar area possibly surgical, increase in RUL lung nodule from 5 mm to 7 mm now hypermetabolic with SUV 2.7, new hypermetabolic left inguinal node, increase in hypermetabolic right inguinal node and right  obturator node, increase in size (?) or RLL lung nodule with other lung nodules stable, and stable hypermetabolic right external iliac  node. Dr Denman George has recommended consideration of CDDP taxol.    Review of systems as above, also: No bleeding. No cough or hemoptysis. No nausea or vomiting. Remainder of 10 point Review of Systems negative.  Husband brought her to appointment, but is not in infusion area at time of my visit  Objective:  Vital signs in last 24 hours: per chemo flow sheets in EMR  Alert, oriented and appropriate, appears comfortable supine in bed on room air. IV infusing without difficulty    HEENT:PERRL, sclerae not icteric. Oral mucosa moist without lesions Neck supple. No JVD.  Lymphatics:no supraclavicular adenopathy Resp: clear to auscultation bilaterally  Cardio: regular rate and rhythm. No gallop. GI: soft, nontender, not distended, no mass or organomegaly. Some bowel sounds.  Musculoskeletal/ Extremities: without pitting edema, cords, tenderness Neuro:  nonfocal including no significant peripheral neuropathy. PSYCH appropriate mood and affect Skin without rash, ecchymosis, petechiae   Lab Results:  Results for orders placed or performed in visit on 09/29/15  CBC with Differential  Result Value Ref Range   WBC 4.3 3.9 - 10.3 10e3/uL   NEUT# 3.8 1.5 - 6.5 10e3/uL   HGB 11.2 (L) 11.6 - 15.9 g/dL   HCT 33.8 (L) 34.8 - 46.6 %   Platelets 254 145 - 400 10e3/uL   MCV 87.4 79.5 - 101.0 fL   MCH 29.0 25.1 - 34.0 pg   MCHC 33.2 31.5 - 36.0 g/dL   RBC 3.87 3.70 - 5.45 10e6/uL   RDW 14.3 11.2 - 14.5 %   lymph# 0.5 (L) 0.9 - 3.3 10e3/uL   MONO# 0.1 0.1 - 0.9 10e3/uL   Eosinophils Absolute 0.0 0.0 - 0.5 10e3/uL   Basophils Absolute 0.0 0.0 - 0.1 10e3/uL   NEUT% 87.1 (H) 38.4 - 76.8 %   LYMPH% 11.1 (L) 14.0 - 49.7 %   MONO% 1.5 0.0 - 14.0 %   EOS% 0.1 0.0 - 7.0 %   BASO% 0.2 0.0 - 2.0 %  Comprehensive metabolic panel (Cmet) - [facility]  Result Value Ref Range   Sodium 138 136 - 145 mEq/L   Potassium 4.5 3.5 - 5.1 mEq/L   Chloride 105 98 - 109 mEq/L   CO2 26 22 - 29 mEq/L   Glucose 159 (H)  70 - 140 mg/dl   BUN 14.7 7.0 - 26.0 mg/dL   Creatinine 0.8 0.6 - 1.1 mg/dL   Total Bilirubin 0.33 0.20 - 1.20 mg/dL   Alkaline Phosphatase 51 40 - 150 U/L   AST 29 5 - 34 U/L   ALT 17 0 - 55 U/L   Total Protein 6.9 6.4 - 8.3 g/dL   Albumin 3.5 3.5 - 5.0 g/dL   Calcium 9.4 8.4 - 10.4 mg/dL   Anion Gap 7 3 - 11 mEq/L   EGFR 82 (L) >90 ml/min/1.73 m2     Studies/Results:  No results found.  Medications: I have reviewed the patient's current medications. Discussed steroids helping generally with energy etc today, tho this will not last longer than a day or so.  NOTE neulasta approved if needed  DISCUSSION: hydration and lasix considerations discussed. Antiemetics reviewed. She knows to call if concerns prior to next scheduled visit. She is in agreement with MD visit + lab on 10-27, as I prefer close attention given her medical situation as we start this treatment.   Message to gyn  onc to coordinate next visit there.  Assessment/Plan: 1.Metastatic squamous cell carcinoma of vulva: local progression following maximal radiation with sensitizing chemotherapy given in [facility] thru 04-19-15, now post right radical vulvectomy at [facility] 07-26-15 and with progressive pulmonary metastatic disease and node involvement. Wound healing slowly as anticipated, no infection. FIrst CDDP taxol 09-29-15 in attempt to control and improve the metastatic disease. She may need gCSF support. I will see her with CBC and chemistries on 10-06-15.  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 Orthopedic surgeries clavicle and left knee 11.social situation: lives an hour away, 64 yo husband will be  driving her to appointments, which she wants in [place]. 12.flu vaccine done 08-2015 13.pneumonia vaccine done 08-2015    All questions answered and patient knows to call if concerns prior to next scheduled visit. Chemo orders confirmed, discussed with RN. Time spent 25 min including >50% counseling and coordination of care. Cc Dr Ulice Brilliant, Dr Mauricio Po, MD   09/29/2015, 12:10 PM

## 2015-09-29 NOTE — Progress Notes (Signed)
Patient was identified to be at risk for malnutrition on the MST secondary to weight loss and poor appetite.  69 year old female diagnosed with recurrent Vulva cancer.  She is a patient of Dr. Marko Plume.  Past medical history includes arthritis, hypertension, MI, hyperlipidemia, and CAD.  Medications include vitamin D, Decadron, Ativan, magnesium, multivitamin, Zofran, omega-3 fatty acids, Compazine, and vitamin C  Labs were reviewed.  Height: 63 inches. Weight: 166.2 pounds. Usual body weight: 176 pounds August 2016. BMI: 29.45.  Patient has history of significant fluid retention. She reports her appetite is good. She states she intended to try to lose weight and has increased fruits and vegetables in her diet. She does not like to drink oral nutrition supplements. She tries to drink a lot of water.  Nutrition diagnosis: Food and nutrition related knowledge deficit related to recurrent cancer as evidenced by no prior need for nutrition related information.  Intervention:  Educated patient on the importance of healthy plant-based diet with adequate protein to preserve lean muscle mass. Educated patient on sources of protein in her diet and provided a fact sheet on increasing calories and protein. Encouraged patient to continue increased fluids. Stressed the importance of maintaining lean body mass. Teach back method was used and questions were answered.  My contact information was provided.  Monitoring, evaluation, goals: Patient will tolerate adequate calories and protein to preserve lean body mass.  Next visit: To be scheduled as needed.  **Disclaimer: This note was dictated with voice recognition software. Similar sounding words can inadvertently be transcribed and this note may contain transcription errors which may not have been corrected upon publication of note.**

## 2015-09-30 ENCOUNTER — Telehealth: Payer: Self-pay

## 2015-09-30 NOTE — Telephone Encounter (Signed)
Ms. Depoy states today is the best she has felt in a while  The steroids may be contributing to it.  She is eating and drinking well.  Her weight is up 4 lbs from yesterday from the IV and Oral fluid intake.  She is not SOB. Bowels moving well. Taking compazine ~8 hrs to prevent n/v. Pt. requested a handicapped placard form from Dr. Marko Plume.  Placed on Dr. Mariana Kaufman desk for follow up appointment 10-06-15. Pt knows to call [phone] if she has any questions or concerns.

## 2015-10-03 ENCOUNTER — Telehealth: Payer: Self-pay | Admitting: *Deleted

## 2015-10-03 NOTE — Telephone Encounter (Signed)
Pt states she did very good for the first 2 days. Sunday- nausea, no vomiting. Monday- "feels like a wet puppy, so tired". Is eating and drinking OK, using pain pills for aches, swelling better in legs- had gained 6.4 lbs post chemo- has 2.4 lbs left to get back to pre-chemo weight. Reviewed use of antiemetics. Will call if needs [place] prior to seeing Dr Marko Plume on Thursday

## 2015-10-03 NOTE — Telephone Encounter (Signed)
-----   Message from Gordy Levan, MD sent at 10/01/2015  9:10 PM EDT ----- Please check on her by phone 10-24 or 10-25. First CDDP taxol on 10-21 Heart disease, but seemed ok with CDDP hydration at treatment.  Taxol aches likely.  She is to see me with labs 10-27  thanks

## 2015-10-05 ENCOUNTER — Other Ambulatory Visit: Payer: Self-pay | Admitting: Oncology

## 2015-10-06 ENCOUNTER — Other Ambulatory Visit (HOSPITAL_BASED_OUTPATIENT_CLINIC_OR_DEPARTMENT_OTHER): Payer: Medicare Other

## 2015-10-06 ENCOUNTER — Other Ambulatory Visit: Payer: Self-pay

## 2015-10-06 ENCOUNTER — Encounter: Payer: Self-pay | Admitting: Oncology

## 2015-10-06 ENCOUNTER — Ambulatory Visit (HOSPITAL_BASED_OUTPATIENT_CLINIC_OR_DEPARTMENT_OTHER): Payer: Medicare Other | Admitting: Oncology

## 2015-10-06 ENCOUNTER — Encounter: Payer: Self-pay | Admitting: General Practice

## 2015-10-06 ENCOUNTER — Telehealth: Payer: Self-pay | Admitting: *Deleted

## 2015-10-06 VITALS — BP 107/65 | HR 96 | Temp 98.2°F | Resp 18 | Ht 63.0 in | Wt 167.3 lb

## 2015-10-06 DIAGNOSIS — C7800 Secondary malignant neoplasm of unspecified lung: Secondary | ICD-10-CM

## 2015-10-06 DIAGNOSIS — C519 Malignant neoplasm of vulva, unspecified: Secondary | ICD-10-CM

## 2015-10-06 DIAGNOSIS — R110 Nausea: Secondary | ICD-10-CM

## 2015-10-06 DIAGNOSIS — G893 Neoplasm related pain (acute) (chronic): Secondary | ICD-10-CM

## 2015-10-06 DIAGNOSIS — T451X5A Adverse effect of antineoplastic and immunosuppressive drugs, initial encounter: Secondary | ICD-10-CM

## 2015-10-06 DIAGNOSIS — I2510 Atherosclerotic heart disease of native coronary artery without angina pectoris: Secondary | ICD-10-CM

## 2015-10-06 LAB — BASIC METABOLIC PANEL (CC13)
ANION GAP: 8 meq/L (ref 3–11)
BUN: 16.1 mg/dL (ref 7.0–26.0)
CALCIUM: 9.4 mg/dL (ref 8.4–10.4)
CO2: 28 meq/L (ref 22–29)
CREATININE: 1 mg/dL (ref 0.6–1.1)
Chloride: 101 mEq/L (ref 98–109)
EGFR: 61 mL/min/{1.73_m2} — ABNORMAL LOW (ref >90)
GLUCOSE: 124 mg/dL (ref 70–140)
Potassium: 4.7 mEq/L (ref 3.5–5.1)
Sodium: 137 mEq/L (ref 136–145)

## 2015-10-06 LAB — CBC WITH DIFFERENTIAL/PLATELET
BASO%: 0.5 % (ref 0.0–2.0)
BASOS ABS: 0 10*3/uL (ref 0.0–0.1)
EOS%: 2.5 % (ref 0.0–7.0)
Eosinophils Absolute: 0.1 10*3/uL (ref 0.0–0.5)
HEMATOCRIT: 34.5 % — AB (ref 34.8–46.6)
HEMOGLOBIN: 11.1 g/dL — AB (ref 11.6–15.9)
LYMPH%: 9.5 % — AB (ref 14.0–49.7)
MCH: 28.5 pg (ref 25.1–34.0)
MCHC: 32.3 g/dL (ref 31.5–36.0)
MCV: 88 fL (ref 79.5–101.0)
MONO#: 0.2 10*3/uL (ref 0.1–0.9)
MONO%: 3.1 % (ref 0.0–14.0)
NEUT#: 5 10*3/uL (ref 1.5–6.5)
NEUT%: 84.4 % — AB (ref 38.4–76.8)
Platelets: 271 10*3/uL (ref 145–400)
RBC: 3.91 10*6/uL (ref 3.70–5.45)
RDW: 14.4 % (ref 11.2–14.5)
WBC: 5.9 10*3/uL (ref 3.9–10.3)
lymph#: 0.6 10*3/uL — ABNORMAL LOW (ref 0.9–3.3)

## 2015-10-06 LAB — MAGNESIUM (CC13): Magnesium: 1.9 mg/dl (ref 1.5–2.5)

## 2015-10-06 MED ORDER — HYDROCODONE-ACETAMINOPHEN 5-325 MG PO TABS: 1.0000 | ORAL_TABLET | Freq: Four times a day (QID) | ORAL | Status: DC | PRN

## 2015-10-06 NOTE — Progress Notes (Signed)
Spiritual Care Note  Met with Amy Mayer after her appointment with Dr Marko Plume to provide further spiritual support.  Amy Mayer benefits from processing out loud and welcomed opportunity to talk through her struggles and priorities.  She is "Amy Mayer" to her nieces/nephews and their children; their close relationships are a primary source of meaning and motivation to continue living through suffering.  She understands her heart attack two years ago as a call to better self-care and to deeper prayer.  Per pt, she receives such strong support from church that she often encourages folks to take care of people who have less that she has.  She shared frankly about symptoms, vulnerability, and hopes.  Being able to name these aloud brings her strength, courage, and focus.  Receiving my prayer support further integrated these themes and reinforced her belovedness (as shown by tears, hugs, and verbalized thanks).  She plans to reach out upon future visits to [facility], but please also page as needs arise.  Thank you.  [place], [place], [facility] Pager [phone] Voicemail [phone]

## 2015-10-06 NOTE — Telephone Encounter (Signed)
Per staff message and POF I have scheduled appts. Advised scheduler of appts. JMW  

## 2015-10-06 NOTE — Progress Notes (Signed)
OFFICE PROGRESS NOTE   October 06, 2015   Physicians:D.ClarkePearson/ E.Emmit Alexanders (PCP [facility] [place] [phone]), Marguerita Merles (radiation oncology [facility] Allegra), Roslynn Amble [facility] cardiology [place] [phone] fax [phone]), Dorrene German (vascular surgery Mikel Cella) ([place] gyn oncology [facility]) Adonis Housekeeper cardiovascular surgery Mikel Cella)  INTERVAL HISTORY:  Patient is seen, alone for visit, having had first taxol CDDP on 09-29-15 for progressive metastatic vulvar carcinoma. She had no symptoms of CHF with the aggressive hydration required for CDDP, did not need lasix tho did have some pedal edema which resolved.She had nausea without vomiting on day 4, antiemetics helpful and was able to keep down liquids. She had no BM x first several days after chemo, finally moved by day 5 and have moved adequately since then, one stool loose. The vulvar wound continues to improve, not painful, still packing. She is SOB going up stairs and "worn out" showering and dressing. She had some taxol aches in feet, arms and sternum. She denies peripheral neuropathy.  The right inguinal adenopathy is painful, tramadol 100 mg every 8 hrs minimally helpful; she had a few hydrocodone 5-325 left from surgery which were more help Pain is worse with certain positions and interferes with sleep.    Flu vaccine 09-05-15 No central catheter  ONCOLOGIC HISTORY Patient presented to PCP Feb 2016 with increasing right vulvar mass measuring 8 x 4 x 3 cm, biopsy with squamous cell carcinoma. PET had uptake in bilateral inguinal nodes and 3 nonspecific lung nodules. US biopsy lymph node at [facility] 02-04-15 [id number]) documented metastatic squamous cell carcinoma. She received radiation by Dr Erlene Quan with sensitizing CDDP chemotherapy, the radiation given from 02-17-15 thru 04-19-15, which was 6300 cGy in 35 fractions to all PET avid disease and 4500 cGy in 25  fractions to all areas at risk of harboring microscopic disease including the pelvis. Chemotherapy records are not available at time of this visit. She had one break in therapy due to vulvitis, diarrhea and at least one episode of low potassium during treatment, but no significant nausea. Initial follow up showed good response, however she had ulcerative lesion on vulva by 07-06-15. Repeat PET 07-04-15 showed residual activity in the vulvar region, resolution of left inguinal nodes, decrease in right inguinal nodes (although some activity remains) and 2 new right external iliac nodes, 3 pulmonary nodules stable with a new 5 mm pulmonary nodule. She was referred from Dr Erlene Quan to Dr Josephina Shih, with consultation on 07-15-15, recommendation for radical resection of right vulvar lesion as palliative procedure, with plan to follow up the external iliac node and possible pulmonary lesions after surgery. She had extended right radical vulvectomy 07-26-15 at [facility]; final pathology [id number]) showed G3 poorly differentiated squamous cell carcinoma with negative margins. She had post op follow up by Dr Josephina Shih on 08-05-15, with perineal wound separation then; she subsequently saw Dr Denman George on 9-9, 9-19 and again today. PET at [facility] 08-23-15 had uptake in vulvar area possibly surgical, increase in RUL lung nodule from 5 mm to 7 mm now hypermetabolic with SUV 2.7, new hypermetabolic left inguinal node, increase in hypermetabolic right inguinal node and right obturator node, increase in size (?) or RLL lung nodule with other lung nodules stable, and stable hypermetabolic right external iliac node. CDDP taxol began 09-29-15.    Review of systems as above, also: No fever. Minimal bleeding with dressing changes at vulvar wound. Swelling upper inner thighs bilaterally as she has had since surgery,  or possibly since RT.Bladder ok Remainder of 10 point Review of Systems negative.  Objective:  Vital signs in last 24  hours:  BP 107/65 mmHg  Pulse 96  Temp(Src) 98.2 F (36.8 C) (Oral)  Resp 18  Ht 5' 3"  (1.6 m)  Wt 167 lb 4.8 oz (75.887 kg)  BMI 29.64 kg/m2  SpO2 96% Weight up 0.5 lb Alert, oriented and appropriate. Ambulatory without assistance.  No alopecia  HEENT:PERRL, sclerae not icteric. Oral mucosa moist without lesions, posterior pharynx clear.  Neck supple. No JVD.  Lymphatics:no cervical,supraclavicular adenopathy. Right inguinal mass tender to light touch. Resp: clear to auscultation bilaterally and normal percussion bilaterally Cardio: regular rate and rhythm. No gallop. GI: soft, nontender, not distended, no mass or organomegaly. Normally active bowel sounds. Surgical incision not remarkable. Musculoskeletal/ Extremities: calves and feet without pitting edema, cords, tenderness. No cords or tenderness thighs. Induration right inguinal area Neuro: no peripheral neuropathy. Otherwise nonfocal. PSYCH appropriate mood and affect Skin without rash, ecchymosis, petechiae   Lab Results:  Results for orders placed or performed in visit on 10/06/15  CBC with Differential  Result Value Ref Range   WBC 5.9 3.9 - 10.3 10e3/uL   NEUT# 5.0 1.5 - 6.5 10e3/uL   HGB 11.1 (L) 11.6 - 15.9 g/dL   HCT 34.5 (L) 34.8 - 46.6 %   Platelets 271 145 - 400 10e3/uL   MCV 88.0 79.5 - 101.0 fL   MCH 28.5 25.1 - 34.0 pg   MCHC 32.3 31.5 - 36.0 g/dL   RBC 3.91 3.70 - 5.45 10e6/uL   RDW 14.4 11.2 - 14.5 %   lymph# 0.6 (L) 0.9 - 3.3 10e3/uL   MONO# 0.2 0.1 - 0.9 10e3/uL   Eosinophils Absolute 0.1 0.0 - 0.5 10e3/uL   Basophils Absolute 0.0 0.0 - 0.1 10e3/uL   NEUT% 84.4 (H) 38.4 - 76.8 %   LYMPH% 9.5 (L) 14.0 - 49.7 %   MONO% 3.1 0.0 - 14.0 %   EOS% 2.5 0.0 - 7.0 %   BASO% 0.5 0.0 - 2.0 %  Basic metabolic panel (Bmet) - [facility]  Result Value Ref Range   Sodium 137 136 - 145 mEq/L   Potassium 4.7 3.5 - 5.1 mEq/L   Chloride 101 98 - 109 mEq/L   CO2 28 22 - 29 mEq/L   Glucose 124 70 - 140 mg/dl   BUN  16.1 7.0 - 26.0 mg/dL   Creatinine 1.0 0.6 - 1.1 mg/dL   Calcium 9.4 8.4 - 10.4 mg/dL   Anion Gap 8 3 - 11 mEq/L   EGFR 61 (L) >90 ml/min/1.73 m2  Magnesium  Result Value Ref Range   Magnesium 1.9 1.5 - 2.5 mg/dl     Studies/Results:  No results found.  Medications: I have reviewed the patient's current medications. Hydrocodone 5-325 written, to use instead of tramadol if needed.   DISCUSSION:  Course since first chemo reviewed as above.  Pain medication as noted.  If additional radiation possible to right inguinal area, that might help pain more quickly than chemotherapy; patient is glad for me to discuss with Dr Erlene Quan, tho she recalls that she may not be able to have additional radiation. Counts ok today, tho may drop in next week. Patient aware that she may be more fatigued if counts lower and will let me know prior to scheduled visit next week if concerns.   Following visit, I have spoken with [facility] [phone], leaving message for Dr Erlene Quan, who  then returned my call. She believes that she may be able to do some limited radiation further to right inguinal area, but with RT doses already given would prefer chemo if able to get benefit with that now. I spoke back with patient by phone to let her know of this conversation, and she is in agreement with recommendation for continuing chemo without RT now.  Assessment/Plan:  1.Metastatic squamous cell carcinoma of vulva: local progression following maximal radiation with sensitizing chemotherapy given in [facility] thru 04-19-15, now post right radical vulvectomy at [facility] 07-26-15 and with progressive pulmonary metastatic disease and node involvement. Wound healing slowly as anticipated, no infection. FIrst CDDP taxol 09-29-15 in attempt to control and improve the metastatic disease. She may need gCSF support. I will see her again with CBC and chemistries on 10-13-15. 2.coronary artery disease post MI and 4 vessel  CABG 2014, followed by Dr Daine Floras. Some transient pedal edema but no significant CHF symptoms with cisplatin hydration. 3.peripheral vascular disease with completely occluded right carotid and post left carotid endarterectomy 4.40 pack year tobacco discontinued 04-2013 5.hyperlipidemia on pravachol 6.completed antibiotic for UTI by PCP, follow up urine culture no growth 09-26-15 and no symptoms now 7.never mammograms 8.never colonoscopy 9.no advance directives 10. Orthopedic surgeries clavicle and left knee 11.social situation: lives an hour away, 19 yo husband will be driving her to appointments, which she wants in [place]. [place] chaplain has met with her following MD visit today - thank you 12.flu vaccine done 08-2015 13.pneumonia vaccine done 08-2015   All questions answered. Time spent 30 min including >50% counseling and coordination of care. Cc Drs Erlene Quan, Denman George and Yevonne Pax, MD   10/06/2015, 4:49 PM

## 2015-10-07 ENCOUNTER — Telehealth: Payer: Self-pay

## 2015-10-07 DIAGNOSIS — T451X5A Adverse effect of antineoplastic and immunosuppressive drugs, initial encounter: Secondary | ICD-10-CM

## 2015-10-07 DIAGNOSIS — R110 Nausea: Secondary | ICD-10-CM | POA: Insufficient documentation

## 2015-10-07 DIAGNOSIS — G893 Neoplasm related pain (acute) (chronic): Secondary | ICD-10-CM | POA: Insufficient documentation

## 2015-10-07 NOTE — Telephone Encounter (Signed)
Pt called asking if her 11/10 appt could be a little later - like 0900 for lab. In-basket message to [facility] sent

## 2015-10-07 NOTE — Telephone Encounter (Signed)
S/w pt that we cannot change appt due to length of treatment. Discussed taxol (new to regimen) will take 3 hours to give. Plus rest of regimen. Pt was accepting of explanation.

## 2015-10-07 NOTE — Telephone Encounter (Signed)
-----   Message from Thomos Lemons, NT sent at 10/07/2015  2:03 PM EDT ----- Regarding: RE: change time She is a 8hr appt, she has to be in chemo by 9am. She can't be any later per policy ----- Message -----    From: Janace Hoard, RN    Sent: 10/07/2015   1:43 PM      To: Lovena Le Wheat, NT Subject: change time                                    Mrs Digiulio would like her time to be a little later on 11/10. Lab and infusion.. She said 0900 would be great. She lives an hour away and her niece is driving her for the first time. Please see what you can do  and call pt. Thanks [employer]

## 2015-10-09 ENCOUNTER — Other Ambulatory Visit: Payer: Self-pay | Admitting: Oncology

## 2015-10-10 ENCOUNTER — Encounter: Payer: Self-pay | Admitting: Oncology

## 2015-10-10 NOTE — Progress Notes (Signed)
[place] for granix 11-3 if needed, per [facility] managed care.  Godfrey Pick, MD

## 2015-10-13 ENCOUNTER — Other Ambulatory Visit (HOSPITAL_BASED_OUTPATIENT_CLINIC_OR_DEPARTMENT_OTHER): Payer: Medicare Other

## 2015-10-13 ENCOUNTER — Encounter: Payer: Self-pay | Admitting: Oncology

## 2015-10-13 ENCOUNTER — Telehealth: Payer: Self-pay | Admitting: Oncology

## 2015-10-13 ENCOUNTER — Ambulatory Visit (HOSPITAL_BASED_OUTPATIENT_CLINIC_OR_DEPARTMENT_OTHER): Payer: Medicare Other | Admitting: Oncology

## 2015-10-13 VITALS — BP 123/77 | HR 92 | Temp 98.2°F | Resp 18 | Ht 63.0 in | Wt 167.2 lb

## 2015-10-13 DIAGNOSIS — C7800 Secondary malignant neoplasm of unspecified lung: Secondary | ICD-10-CM

## 2015-10-13 DIAGNOSIS — D6481 Anemia due to antineoplastic chemotherapy: Secondary | ICD-10-CM

## 2015-10-13 DIAGNOSIS — G893 Neoplasm related pain (acute) (chronic): Secondary | ICD-10-CM

## 2015-10-13 DIAGNOSIS — C519 Malignant neoplasm of vulva, unspecified: Secondary | ICD-10-CM | POA: Diagnosis present

## 2015-10-13 DIAGNOSIS — C774 Secondary and unspecified malignant neoplasm of inguinal and lower limb lymph nodes: Secondary | ICD-10-CM

## 2015-10-13 DIAGNOSIS — C775 Secondary and unspecified malignant neoplasm of intrapelvic lymph nodes: Secondary | ICD-10-CM

## 2015-10-13 DIAGNOSIS — I2510 Atherosclerotic heart disease of native coronary artery without angina pectoris: Secondary | ICD-10-CM

## 2015-10-13 LAB — CBC WITH DIFFERENTIAL/PLATELET
BASO%: 1.1 % (ref 0.0–2.0)
Basophils Absolute: 0 10*3/uL (ref 0.0–0.1)
EOS%: 3.7 % (ref 0.0–7.0)
Eosinophils Absolute: 0.2 10*3/uL (ref 0.0–0.5)
HCT: 33.9 % — ABNORMAL LOW (ref 34.8–46.6)
HGB: 11 g/dL — ABNORMAL LOW (ref 11.6–15.9)
LYMPH%: 16.5 % (ref 14.0–49.7)
MCH: 28.6 pg (ref 25.1–34.0)
MCHC: 32.5 g/dL (ref 31.5–36.0)
MCV: 87.9 fL (ref 79.5–101.0)
MONO#: 0.5 10*3/uL (ref 0.1–0.9)
MONO%: 13.1 % (ref 0.0–14.0)
NEUT#: 2.7 10*3/uL (ref 1.5–6.5)
NEUT%: 65.6 % (ref 38.4–76.8)
PLATELETS: 336 10*3/uL (ref 145–400)
RBC: 3.86 10*6/uL (ref 3.70–5.45)
RDW: 14.4 % (ref 11.2–14.5)
WBC: 4.1 10*3/uL (ref 3.9–10.3)
lymph#: 0.7 10*3/uL — ABNORMAL LOW (ref 0.9–3.3)

## 2015-10-13 LAB — COMPREHENSIVE METABOLIC PANEL (CC13)
ALBUMIN: 3.3 g/dL — AB (ref 3.5–5.0)
ALK PHOS: 63 U/L (ref 40–150)
ALT: 14 U/L (ref 0–55)
AST: 16 U/L (ref 5–34)
Anion Gap: 9 mEq/L (ref 3–11)
BUN: 19.1 mg/dL (ref 7.0–26.0)
CHLORIDE: 106 meq/L (ref 98–109)
CO2: 25 mEq/L (ref 22–29)
Calcium: 9.5 mg/dL (ref 8.4–10.4)
Creatinine: 0.8 mg/dL (ref 0.6–1.1)
EGFR: 78 mL/min/{1.73_m2} — ABNORMAL LOW (ref >90)
GLUCOSE: 94 mg/dL (ref 70–140)
POTASSIUM: 4.1 meq/L (ref 3.5–5.1)
SODIUM: 141 meq/L (ref 136–145)
Total Bilirubin: <0.3 mg/dL (ref 0.20–1.20)
Total Protein: 6.9 g/dL (ref 6.4–8.3)

## 2015-10-13 MED ORDER — FERROUS FUMARATE 325 (106 FE) MG PO TABS
ORAL_TABLET | ORAL | Status: DC
Start: 2015-10-13 — End: 2015-11-09

## 2015-10-13 MED ORDER — FERROUS FUMARATE 325 (106 FE) MG PO TABS: ORAL_TABLET | ORAL | Status: DC

## 2015-10-13 MED ORDER — DEXAMETHASONE 4 MG PO TABS: ORAL_TABLET | ORAL | Status: AC

## 2015-10-13 NOTE — Telephone Encounter (Signed)
Appointments made and avs printed for patient °

## 2015-10-13 NOTE — Progress Notes (Signed)
OFFICE PROGRESS NOTE   October 13, 2015   Physicians:D.ClarkePearson/ E.Emmit Alexanders (PCP [facility] [place] [phone]), Marguerita Merles (radiation oncology [place] Allegra), Roslynn Amble [facility] cardiology [place] [phone] fax [phone]), Dorrene German (vascular surgery Mikel Cella) ([place] gyn oncology [facility]) Adonis Housekeeper cardiovascular surgery Mikel Cella)  INTERVAL HISTORY:  Patient is seen, alone for visit, in continuing attention to CDDP taxol chemotherapy begun 09-29-15 for progressive metastatic vulvar carcinoma. Discomfort from bilateral inguinal adenopathy improved initially after this first treatment. She has not needed gCSF.  Patient had only limited nausea after the chemotherapy, none now and has been eating well. Bowels are moving adequately. She needed no pain medication for five days last week, but has had some discomfort again in right inguinal area for past 3 days, managed with oxycodone at hs only. The vulvar surgical wound seems to be healing, no increased discomfort. No fever. No UTI symptoms. Taxol aches resolved. No SOB or chest pain. No peripheral neuropathy. She has been referred back to lymphedema PT at [facility] by Dr Erlene Quan, as this had been helpful previously. GERD better with Pepcid AC.   Review of systems as above, remainder of 10 point Review of Systems negative.  Flu vaccine 09-05-15 No central catheter  ONCOLOGIC HISTORY Patient presented to PCP Feb 2016 with increasing right vulvar mass measuring 8 x 4 x 3 cm, biopsy with squamous cell carcinoma. PET had uptake in bilateral inguinal nodes and 3 nonspecific lung nodules. US biopsy lymph node at [facility] 02-04-15 [phone]) documented metastatic squamous cell carcinoma. She received radiation by Dr Erlene Quan with sensitizing CDDP chemotherapy, the radiation given from 02-17-15 thru 04-19-15, which was 6300 cGy in 35 fractions to all PET avid disease and  4500 cGy in 25 fractions to all areas at risk of harboring microscopic disease including the pelvis. Chemotherapy records are not available at time of this visit. She had one break in therapy due to vulvitis, diarrhea and at least one episode of low potassium during treatment, but no significant nausea. Initial follow up showed good response, however she had ulcerative lesion on vulva by 07-06-15. Repeat PET 07-04-15 showed residual activity in the vulvar region, resolution of left inguinal nodes, decrease in right inguinal nodes (although some activity remains) and 2 new right external iliac nodes, 3 pulmonary nodules stable with a new 5 mm pulmonary nodule. She was referred from Dr Erlene Quan to Dr Josephina Shih, with consultation on 07-15-15, recommendation for radical resection of right vulvar lesion as palliative procedure, with plan to follow up the external iliac node and possible pulmonary lesions after surgery. She had extended right radical vulvectomy 07-26-15 at [facility]; final pathology [phone]) showed G3 poorly differentiated squamous cell carcinoma with negative margins. She had post op follow up by Dr Josephina Shih on 08-05-15, with perineal wound separation then; she subsequently saw Dr Denman George on 9-9, 9-19 and again today. PET at [facility] 08-23-15 had uptake in vulvar area possibly surgical, increase in RUL lung nodule from 5 mm to 7 mm now hypermetabolic with SUV 2.7, new hypermetabolic left inguinal node, increase in hypermetabolic right inguinal node and right obturator node, increase in size (?) or RLL lung nodule with other lung nodules stable, and stable hypermetabolic right external iliac node. CDDP taxol began 09-29-15.      Objective:  Vital signs in last 24 hours:  BP 123/77 mmHg  Pulse 92  Temp(Src) 98.2 F (36.8 C) (Oral)  Resp 18  Ht 5' 3"  (1.6 m)  Wt 167 lb 3.2 oz (75.841 kg)  BMI 29.63 kg/m2  SpO2 100% Weight stable Alert, oriented and appropriate. Ambulatory without difficulty.   No alopecia  HEENT:PERRL, sclerae not icteric. Oral mucosa moist without lesions, posterior pharynx clear.  Neck supple. No JVD.  Lymphatics:no supraclavicular adenopathy. Right inguinal area extremely sensitive to very gentle exam, with firm node palpable ~ 3x4 cm, induration in the area from previous radiation. Resp: clear to auscultation bilaterally and normal percussion bilaterally Cardio: regular rate and rhythm. No gallop. GI: soft, nontender including no tenderness epigastrium, not distended, no mass or organomegaly. Normally active bowel sounds. Musculoskeletal/ Extremities: without pitting edema, cords, tenderness Neuro: no peripheral neuropathy. Otherwise nonfocal. PSYCH appropriate mood and affect Skin otherwise without rash, ecchymosis, petechiae   Lab Results:  Results for orders placed or performed in visit on 10/13/15  CBC with Differential  Result Value Ref Range   WBC 4.1 3.9 - 10.3 10e3/uL   NEUT# 2.7 1.5 - 6.5 10e3/uL   HGB 11.0 (L) 11.6 - 15.9 g/dL   HCT 33.9 (L) 34.8 - 46.6 %   Platelets 336 145 - 400 10e3/uL   MCV 87.9 79.5 - 101.0 fL   MCH 28.6 25.1 - 34.0 pg   MCHC 32.5 31.5 - 36.0 g/dL   RBC 3.86 3.70 - 5.45 10e6/uL   RDW 14.4 11.2 - 14.5 %   lymph# 0.7 (L) 0.9 - 3.3 10e3/uL   MONO# 0.5 0.1 - 0.9 10e3/uL   Eosinophils Absolute 0.2 0.0 - 0.5 10e3/uL   Basophils Absolute 0.0 0.0 - 0.1 10e3/uL   NEUT% 65.6 38.4 - 76.8 %   LYMPH% 16.5 14.0 - 49.7 %   MONO% 13.1 0.0 - 14.0 %   EOS% 3.7 0.0 - 7.0 %   BASO% 1.1 0.0 - 2.0 %  Comprehensive metabolic panel (Cmet) - [facility]  Result Value Ref Range   Sodium 141 136 - 145 mEq/L   Potassium 4.1 3.5 - 5.1 mEq/L   Chloride 106 98 - 109 mEq/L   CO2 25 22 - 29 mEq/L   Glucose 94 70 - 140 mg/dl   BUN 19.1 7.0 - 26.0 mg/dL   Creatinine 0.8 0.6 - 1.1 mg/dL   Total Bilirubin <0.30 0.20 - 1.20 mg/dL   Alkaline Phosphatase 63 40 - 150 U/L   AST 16 5 - 34 U/L   ALT 14 0 - 55 U/L   Total Protein 6.9 6.4 - 8.3 g/dL    Albumin 3.3 (L) 3.5 - 5.0 g/dL   Calcium 9.5 8.4 - 10.4 mg/dL   Anion Gap 9 3 - 11 mEq/L   EGFR 78 (L) >90 ml/min/1.73 m2     Studies/Results:  No results found.  Medications: I have reviewed the patient's current medications. Note neulasta approved, but not needed thus far. Fine to continue pepcid AC. Begin Hemocyte/ ferrous fumarate on empty stomach with Vit C  DISCUSSION: interval history reviewed as above. She is encouraged by improvement in the right inguinal symptoms and is in agreement with continuing chemo as planned, cycle 2 on 10-20-15 as long as [place] >=1.5, plt >=100k and renal function meets parameters.  Begin oral iron as above. Claritin and prn oxycodone for taxol aches.  Assessment/Plan:  1.Metastatic squamous cell carcinoma of vulva: local progression following maximal radiation with sensitizing chemotherapy given in [facility] thru 04-19-15,  subsequent right radical vulvectomy at [facility] 07-26-15. Progressive pulmonary metastatic disease and node involvement. Wound healing slowly as anticipated, no infection. FIrst CDDP taxol  09-29-15 tolerated well overall, for cycle 2 on 10-20-15. I will see her coordinating with Dr Serita Grit follow up on 10-31-15. She will let us know if any concerns prior to those visits.  2.coronary artery disease post MI and 4 vessel CABG 2014, followed by Dr Daine Floras. Hyperlipidemia. Some transient pedal edema but no significant CHF symptoms with cisplatin hydration. 3.peripheral vascular disease with completely occluded right carotid and post left carotid endarterectomy 4.40 pack year tobacco discontinued 04-2013 5.flu vaccine 08-2015. Pneumonia vaccine 08-2015 6.GERD: continue Pepcid AC 7.never mammograms 8.never colonoscopy 9.no advance directives 10. Orthopedic surgeries clavicle and left knee 11.social situation: lives an hour away, but requests treatment in [place]. Mild anemia related to surgery, chemo, chronic disease, blood draws. Add oral  iron as ferrous fumarate and follow  All questions answered and she is in agreement with recommendations and plans. Chemo orders in place, message to infusion that she needs bed if possible. Time spent 25 min including >50% counseling and coordination of care. Cc Drs Erlene Quan, Yevonne Pax, MD   10/13/2015, 1:33 PM

## 2015-10-13 NOTE — Patient Instructions (Signed)
Start Pepcid AC 1-2 tablets daily  Hemocyte or ferrous fumarate (iron) one tablet with vitamin C daily, on empty stomach (so ~ 1 hr before meals or 2 hrs after meals)  Refill on steroid decadron will have enough in bottle for 2 chemo treatments. Take 5 tablets with food 12 hrs before taxol and 5 tablets with food 6 hrs before taxol

## 2015-10-20 ENCOUNTER — Ambulatory Visit (HOSPITAL_BASED_OUTPATIENT_CLINIC_OR_DEPARTMENT_OTHER): Payer: Medicare Other

## 2015-10-20 ENCOUNTER — Other Ambulatory Visit: Payer: Self-pay | Admitting: Oncology

## 2015-10-20 ENCOUNTER — Other Ambulatory Visit (HOSPITAL_BASED_OUTPATIENT_CLINIC_OR_DEPARTMENT_OTHER): Payer: Medicare Other

## 2015-10-20 VITALS — BP 145/78 | HR 93 | Temp 97.1°F | Resp 18

## 2015-10-20 DIAGNOSIS — C775 Secondary and unspecified malignant neoplasm of intrapelvic lymph nodes: Secondary | ICD-10-CM | POA: Diagnosis not present

## 2015-10-20 DIAGNOSIS — Z5111 Encounter for antineoplastic chemotherapy: Secondary | ICD-10-CM

## 2015-10-20 DIAGNOSIS — C519 Malignant neoplasm of vulva, unspecified: Secondary | ICD-10-CM

## 2015-10-20 LAB — COMPREHENSIVE METABOLIC PANEL (CC13)
ALT: 13 U/L (ref 0–55)
ANION GAP: 12 meq/L — AB (ref 3–11)
AST: 16 U/L (ref 5–34)
Albumin: 3.6 g/dL (ref 3.5–5.0)
Alkaline Phosphatase: 63 U/L (ref 40–150)
BUN: 23.5 mg/dL (ref 7.0–26.0)
CHLORIDE: 105 meq/L (ref 98–109)
CO2: 24 meq/L (ref 22–29)
Calcium: 10 mg/dL (ref 8.4–10.4)
Creatinine: 0.9 mg/dL (ref 0.6–1.1)
EGFR: 65 mL/min/{1.73_m2} — AB (ref >90)
Glucose: 197 mg/dl — ABNORMAL HIGH (ref 70–140)
Potassium: 4.2 mEq/L (ref 3.5–5.1)
Sodium: 140 mEq/L (ref 136–145)
Total Protein: 7.3 g/dL (ref 6.4–8.3)

## 2015-10-20 LAB — CBC WITH DIFFERENTIAL/PLATELET
BASO%: 0 % (ref 0.0–2.0)
BASOS ABS: 0 10*3/uL (ref 0.0–0.1)
EOS ABS: 0 10*3/uL (ref 0.0–0.5)
EOS%: 0 % (ref 0.0–7.0)
HCT: 35.7 % (ref 34.8–46.6)
HGB: 11.4 g/dL — ABNORMAL LOW (ref 11.6–15.9)
LYMPH%: 9.1 % — AB (ref 14.0–49.7)
MCH: 28.5 pg (ref 25.1–34.0)
MCHC: 31.9 g/dL (ref 31.5–36.0)
MCV: 89.3 fL (ref 79.5–101.0)
MONO#: 0.1 10*3/uL (ref 0.1–0.9)
MONO%: 0.8 % (ref 0.0–14.0)
NEUT#: 7 10*3/uL — ABNORMAL HIGH (ref 1.5–6.5)
NEUT%: 90.1 % — AB (ref 38.4–76.8)
PLATELETS: 305 10*3/uL (ref 145–400)
RBC: 4 10*6/uL (ref 3.70–5.45)
RDW: 13.9 % (ref 11.2–14.5)
WBC: 7.7 10*3/uL (ref 3.9–10.3)
lymph#: 0.7 10*3/uL — ABNORMAL LOW (ref 0.9–3.3)

## 2015-10-20 LAB — MAGNESIUM (CC13): MAGNESIUM: 2 mg/dL (ref 1.5–2.5)

## 2015-10-20 MED ORDER — POTASSIUM CHLORIDE 2 MEQ/ML IV SOLN
Freq: Once | INTRAVENOUS | Status: AC
  Administered 2015-10-20: 10:00:00 via INTRAVENOUS
  Filled 2015-10-20: qty 10

## 2015-10-20 MED ORDER — DIPHENHYDRAMINE HCL 50 MG/ML IJ SOLN
50.0000 mg | Freq: Once | INTRAMUSCULAR | Status: AC
  Administered 2015-10-20: 50 mg via INTRAVENOUS

## 2015-10-20 MED ORDER — SODIUM CHLORIDE 0.9 % IV SOLN
50.0000 mg/m2 | Freq: Once | INTRAVENOUS | Status: AC
  Administered 2015-10-20: 92 mg via INTRAVENOUS
  Filled 2015-10-20: qty 92

## 2015-10-20 MED ORDER — DIPHENHYDRAMINE HCL 50 MG/ML IJ SOLN
INTRAMUSCULAR | Status: AC
  Filled 2015-10-20: qty 1

## 2015-10-20 MED ORDER — PALONOSETRON HCL INJECTION 0.25 MG/5ML
0.2500 mg | Freq: Once | INTRAVENOUS | Status: AC
  Administered 2015-10-20: 0.25 mg via INTRAVENOUS

## 2015-10-20 MED ORDER — PALONOSETRON HCL INJECTION 0.25 MG/5ML
INTRAVENOUS | Status: AC
  Filled 2015-10-20: qty 5

## 2015-10-20 MED ORDER — SODIUM CHLORIDE 0.9 % IV SOLN
Freq: Once | INTRAVENOUS | Status: AC
  Administered 2015-10-20: 09:00:00 via INTRAVENOUS

## 2015-10-20 MED ORDER — FAMOTIDINE IN NACL 20-0.9 MG/50ML-% IV SOLN
20.0000 mg | Freq: Two times a day (BID) | INTRAVENOUS | Status: DC
  Administered 2015-10-20: 20 mg via INTRAVENOUS

## 2015-10-20 MED ORDER — PACLITAXEL CHEMO INJECTION 300 MG/50ML
135.0000 mg/m2 | Freq: Once | INTRAVENOUS | Status: AC
  Administered 2015-10-20: 246 mg via INTRAVENOUS
  Filled 2015-10-20: qty 41

## 2015-10-20 MED ORDER — FAMOTIDINE IN NACL 20-0.9 MG/50ML-% IV SOLN
INTRAVENOUS | Status: AC
  Filled 2015-10-20: qty 50

## 2015-10-20 NOTE — Progress Notes (Signed)
12:45 Spoke with Dr. Marko Plume to update her on urine output of 450. Lasix not needed per Dr. Marko Plume.

## 2015-10-20 NOTE — Patient Instructions (Signed)
[facility] Discharge Instructions for Patients Receiving Chemotherapy  Today you received the following chemotherapy agents Taxol and Cisplatin  To help prevent nausea and vomiting after your treatment, we encourage you to take your nausea medication Compazine 10 mg every 6 hours as needed  If you develop nausea and vomiting that is not controlled by your nausea medication, call the clinic.   BELOW ARE SYMPTOMS THAT SHOULD BE REPORTED IMMEDIATELY:  *FEVER GREATER THAN 100.5 F  *CHILLS WITH OR WITHOUT FEVER  NAUSEA AND VOMITING THAT IS NOT CONTROLLED WITH YOUR NAUSEA MEDICATION  *UNUSUAL SHORTNESS OF BREATH  *UNUSUAL BRUISING OR BLEEDING  TENDERNESS IN MOUTH AND THROAT WITH OR WITHOUT PRESENCE OF ULCERS  *URINARY PROBLEMS  *BOWEL PROBLEMS  UNUSUAL RASH Items with * indicate a potential emergency and should be followed up as soon as possible.  Feel free to call the clinic you have any questions or concerns. The clinic phone number is [phone].  Please show the CHEMO ALERT CARD at check-in to the Emergency Department and triage nurse.   

## 2015-10-29 ENCOUNTER — Other Ambulatory Visit: Payer: Self-pay | Admitting: Oncology

## 2015-10-31 ENCOUNTER — Other Ambulatory Visit (HOSPITAL_BASED_OUTPATIENT_CLINIC_OR_DEPARTMENT_OTHER): Payer: Medicare Other

## 2015-10-31 ENCOUNTER — Ambulatory Visit: Payer: Medicare Other | Attending: Gynecologic Oncology | Admitting: Gynecologic Oncology

## 2015-10-31 ENCOUNTER — Encounter: Payer: Self-pay | Admitting: Gynecologic Oncology

## 2015-10-31 ENCOUNTER — Ambulatory Visit (HOSPITAL_BASED_OUTPATIENT_CLINIC_OR_DEPARTMENT_OTHER): Payer: Medicare Other | Admitting: Oncology

## 2015-10-31 ENCOUNTER — Telehealth: Payer: Self-pay | Admitting: Oncology

## 2015-10-31 VITALS — BP 126/77 | HR 83 | Temp 98.2°F | Resp 18 | Ht 63.0 in | Wt 165.8 lb

## 2015-10-31 VITALS — BP 124/67 | HR 106 | Temp 98.2°F | Resp 18 | Ht 63.0 in | Wt 165.8 lb

## 2015-10-31 DIAGNOSIS — C519 Malignant neoplasm of vulva, unspecified: Secondary | ICD-10-CM | POA: Diagnosis present

## 2015-10-31 DIAGNOSIS — T451X5A Adverse effect of antineoplastic and immunosuppressive drugs, initial encounter: Secondary | ICD-10-CM

## 2015-10-31 DIAGNOSIS — G893 Neoplasm related pain (acute) (chronic): Secondary | ICD-10-CM

## 2015-10-31 DIAGNOSIS — T8131XD Disruption of external operation (surgical) wound, not elsewhere classified, subsequent encounter: Secondary | ICD-10-CM

## 2015-10-31 DIAGNOSIS — G620 Drug-induced polyneuropathy: Secondary | ICD-10-CM

## 2015-10-31 DIAGNOSIS — D6481 Anemia due to antineoplastic chemotherapy: Secondary | ICD-10-CM

## 2015-10-31 DIAGNOSIS — C775 Secondary and unspecified malignant neoplasm of intrapelvic lymph nodes: Secondary | ICD-10-CM | POA: Diagnosis not present

## 2015-10-31 LAB — CBC WITH DIFFERENTIAL/PLATELET
BASO%: 0.9 % (ref 0.0–2.0)
Basophils Absolute: 0 10*3/uL (ref 0.0–0.1)
EOS%: 3.7 % (ref 0.0–7.0)
Eosinophils Absolute: 0.2 10*3/uL (ref 0.0–0.5)
HEMATOCRIT: 31.8 % — AB (ref 34.8–46.6)
HEMOGLOBIN: 10.3 g/dL — AB (ref 11.6–15.9)
LYMPH#: 0.8 10*3/uL — AB (ref 0.9–3.3)
LYMPH%: 17.1 % (ref 14.0–49.7)
MCH: 28.1 pg (ref 25.1–34.0)
MCHC: 32.6 g/dL (ref 31.5–36.0)
MCV: 86.4 fL (ref 79.5–101.0)
MONO#: 0.8 10*3/uL (ref 0.1–0.9)
MONO%: 17 % — AB (ref 0.0–14.0)
NEUT%: 61.3 % (ref 38.4–76.8)
NEUTROS ABS: 2.8 10*3/uL (ref 1.5–6.5)
Platelets: 287 10*3/uL (ref 145–400)
RBC: 3.67 10*6/uL — ABNORMAL LOW (ref 3.70–5.45)
RDW: 15.3 % — AB (ref 11.2–14.5)
WBC: 4.6 10*3/uL (ref 3.9–10.3)

## 2015-10-31 LAB — COMPREHENSIVE METABOLIC PANEL (CC13)
ALT: 19 U/L (ref 0–55)
ANION GAP: 10 meq/L (ref 3–11)
AST: 22 U/L (ref 5–34)
Albumin: 3.1 g/dL — ABNORMAL LOW (ref 3.5–5.0)
Alkaline Phosphatase: 58 U/L (ref 40–150)
BILIRUBIN TOTAL: 0.4 mg/dL (ref 0.20–1.20)
BUN: 15.5 mg/dL (ref 7.0–26.0)
CALCIUM: 9.1 mg/dL (ref 8.4–10.4)
CHLORIDE: 102 meq/L (ref 98–109)
CO2: 25 mEq/L (ref 22–29)
CREATININE: 0.8 mg/dL (ref 0.6–1.1)
EGFR: 78 mL/min/{1.73_m2} — AB (ref >90)
Glucose: 99 mg/dl (ref 70–140)
Potassium: 4.3 mEq/L (ref 3.5–5.1)
Sodium: 137 mEq/L (ref 136–145)
TOTAL PROTEIN: 6.4 g/dL (ref 6.4–8.3)

## 2015-10-31 LAB — MAGNESIUM (CC13): MAGNESIUM: 1.8 mg/dL (ref 1.5–2.5)

## 2015-10-31 NOTE — Telephone Encounter (Signed)
Appointments made and avs printed for patient °

## 2015-10-31 NOTE — Patient Instructions (Signed)
Plan for a PET scan after 4 cycles of chemotherapy.  Please call for any questions or concerns.  Plan to meet with Dr. Denman George in the new year after you have had your scan.  Please call us when you are here seeing [place] and scheduling your appointments for Jan or Feb so we can make an appt with Dr. Denman George on the same day you see Dr. Marko Plume.

## 2015-10-31 NOTE — Progress Notes (Signed)
Followup Note: Gyn-Onc   Amy Mayer 69 y.o. female  Chief Complaint  Patient presents with  . Vulvar Cancer    Follow up    Assessment :   Recurrent carcinoma of the vulva (right) status post radical vulvectomy on 07/26/2015. Perineal wound separation. Postoperative wound infection - healing.  Evidence of systemic disease on postop PET (pelvic lymphadenopathy, inguinal lymphadenopathy, increasing pulmonary nodule).  Plan: I recommend the patient continue a sitz bath twice a day, use the hand-held showerhead to irrigate the perineum, at the area dry, and then continue to apply a wet gauze and to change this as a wet to dry dressing with each toiletting event and evenings and mornings. She is healing slowly but appropriately.  Plan is to continue systemic therapy with cisplatin and paclitaxel with Dr Marko Plume, at least 6 cycles. Palliative intent. Recommend PET/CT after cycle 3 to monitor for progression/regression. She is scheduled to start this on Thursday.  I will see her back in the new year after her 4th cycle to re-evaluate vulva and to go over PETCT.  HPI: 69 year old white married female seen in consultation at the request of Dr. Marguerita Merles (radiation oncologist at [facility] [place]) regarding management of recurrent vulvar carcinoma.  The patient initially presented in the spring of 2016 with an enlarging right vulvar mass involving the right vulva measured approximate 8 x 4 x 3 cm. Biopsy revealed squamous cell carcinoma. Patient underwent a PET scan showing uptake in bilateral inguinal nodes and 3 nonspecific lung nodules. She received definitive chemoradiation therapy. Radiation course extended between 02/17/2015 and 04/19/2015. She received a total of 6300 cGy in 35 fractions to all PET avid disease and 4500 cGy in 25 fractions to all areas at risk of harboring microscopic disease including the pelvis. She had one break in therapy due to  vulvitis. Initial follow-up examination showed complete response although on July 27 Dr. Erlene Quan found a ulcerative lesion consistent with recurrent disease. A subsequent PET scan showed some residual activity in the vulvar region, resolution of left inguinal nodes, decrease in right inguinal nodes (although some activity remains) and 2 new right external iliac nodes. The 3 pulmonary nodules are stable although there is a new 5 mm pulmonary nodule as well.  She underwent extended radical vulvectomy of the right vulvar lesion on 07/26/2015 at [facility]. All surgical margins were negative.  She developed a postoperative wound separation and cellulitis which was debrided and then treated with wet to dry dressings.   She saw her Radiation Oncologist, Dr Erlene Quan, in [place] in September, 2016 and reviewed her PET from [facility].  On 08/23/15 it demonstrated a new 14 x 8 mm left inguinal lymph node that was FDG avid. An increase in hypermetabolic right inguinal node measuring 17 x 15 mm that was FDG avid. An increase in the size of a right upper lobe lung nodule now measuring 7 mm (from 5 mm) that was FDG avid. Scattered additional small lung nodules were stable in size. There was right external iliac lymph node measuring 10 mm that was FDG avid. A right obturator lymph node measured 13 mm and was also FDG appendectomy. There was surrounding hypermetabolic activity in the region of the vulva.  Interval Hx: The patient started adjuvant cisplatin, paclitaxel on 10/05/15 and is s/p cycle 2. Cycle 3 is scheduled for 11/10/15. She is tolerating chemotherapy well with the exception of significant fatigue.  Review of Systems:10 point review of systems is negative except  as noted in interval history. Except for general malaise.  Vitals: Blood pressure 126/77, pulse 83, temperature 98.2 F (36.8 C), temperature source Oral, resp. rate 18, height 5\' 3"  (1.6 m), weight 165 lb 12.8 oz (75.206 kg), SpO2 100  %.  Physical Exam: General : The patient is a healthy woman in no acute distress.  HEENT: normocephalic, extraoccular movements normal; neck is supple without thyromegally  Lynphnodes: Supraclavicular. There is thickening in the inguinal regions (right>left) secondary to recent radiation therapy (the right is very tender) and "tanning" of the skin. Abdomen: Soft, non-tender, no ascites, no organomegally, no masses, no hernias  Pelvic:  EGBUS:  Status post radical vulvectomy. The advancement flaps seen to be healing well at the present time. However in the midline perineum there is separation of the wound. 3cm crater at perineum with granulation tissue. Healing well. No debriding necessary. Dressing replaced.  Urethra and Bladder: Normal, non-tender  Cervix: Unable to visualize. Uterus: Could not evaluate.   Rectal: normal sphincter tone, no masses, no blood , the anus and rectum are free from the ulcerative lesion. Lower extremities: Mild lymphedema in the upper thighs.     Allergies  Allergen Reactions  . Aspirin Other (See Comments)    "Only uncoated" makes stomach upset  . Codeine Nausea And Vomiting  . Penicillins Hives  . Morphine Anxiety    Past Medical History  Diagnosis Date  . Arthritis   . Hypertension   . Myocardial infarction ([place]) 04/2013  . Hyperlipidemia   . Coronary artery disease   . Cataracts, bilateral   . Carotid stenosis     Past Surgical History  Procedure Laterality Date  . Tonsillectomy  1964  . Aortic valve replacement (avr)/coronary artery bypass grafting (cabg)  2014  . Carotid endarterectomy  10/18/2014  . Clavicle surgery  1997  . Cardiac catheterization      Current Outpatient Prescriptions  Medication Sig Dispense Refill  . aspirin EC 81 MG tablet Take 81 mg by mouth.    . cetirizine (ZYRTEC) 10 MG tablet Take 10 mg by mouth as needed.     . Cholecalciferol (VITAMIN D-3) 1000 UNITS CAPS Take 1,000 Units by mouth daily.     Marland Kitchen  dexamethasone (DECADRON) 4 MG tablet Take 5 tablets =20 mg  with food 12 hrs and 6 hrs prior to Taxol. 20 tablet 1  . ferrous fumarate (HEMOCYTE - 106 MG FE) 325 (106 FE) MG TABS tablet Take 1 tablet daily with Vitamin C tablet. 30 each 2  . HYDROcodone-acetaminophen (NORCO) 5-325 MG tablet Take 1-2 tablets by mouth every 6 (six) hours as needed for moderate pain. 40 tablet 0  . LORazepam (ATIVAN) 0.5 MG tablet Take 0.25-0.5 mg by mouth every 6 (six) hours as needed.    . Magnesium 250 MG TABS Take 2 tablets by mouth daily.     . Multiple Vitamin (THERA) TABS Take 1 tablet by mouth daily.     . mupirocin ointment (BACTROBAN) 2 % Apply 1 application topically daily.    . Omega-3 Fatty Acids (FISH OIL) 1000 MG CAPS Take 2 capsules by mouth daily.     . ondansetron (ZOFRAN) 8 MG tablet Take by mouth every 8 (eight) hours as needed for nausea or vomiting.    . Potassium Gluconate 2 MEQ TABS Take 400 mg by mouth daily.     . pravastatin (PRAVACHOL) 80 MG tablet Take 80 mg by mouth daily.     . prochlorperazine (COMPAZINE) 10 MG tablet     .  traMADol (ULTRAM) 50 MG tablet Take 1-2 tablets (50-100 mg total) by mouth every 6 (six) hours as needed. (Patient not taking: Reported on 10/31/2015) 60 tablet 0  . vitamin C (ASCORBIC ACID) 500 MG tablet Take 500 mg by mouth daily.     No current facility-administered medications for this visit.    Social History   Social History  . Marital Status: Married    Spouse Name: N/A  . Number of Children: N/A  . Years of Education: N/A   Occupational History  . Not on file.   Social History Main Topics  . Smoking status: Former Smoker -- 1.00 packs/day for 40 years    Quit date: 05/08/2013  . Smokeless tobacco: Not on file  . Alcohol Use: Yes     Comment: wine - several times a week  . Drug Use: No  . Sexual Activity: No   Other Topics Concern  . Not on file   Social History Narrative    Family History  Problem Relation Age of Onset  .  Hypertension Father   . Coronary artery disease Father   . Alcoholism Father   . Diabetes Maternal Aunt   . Arthritis Mother   . Heart disease Brother       Donaciano Eva, MD 10/31/2015, 2:35 PM

## 2015-11-02 ENCOUNTER — Telehealth: Payer: Self-pay | Admitting: *Deleted

## 2015-11-02 NOTE — Telephone Encounter (Signed)
TC from patient stating she is having worsening diarrhea.3-4,5 times a day.  Already today she has had 4 episodes of diarrhea. Pt states she was told not to take anything for her diarrhea, that it might be an infection. Pt states the diarrhea is getting worse. She is eating some.  Pt has called her PCP and is going there today for stool culture and possible IVF's.  Pt states she doesn't think she could make it here to the cancer center with all the diarrhea she is having as she lives in [place]. Advised pt to let us know what transpires at Dr. Felicita Gage office.

## 2015-11-04 ENCOUNTER — Encounter: Payer: Self-pay | Admitting: Oncology

## 2015-11-04 ENCOUNTER — Other Ambulatory Visit: Payer: Self-pay | Admitting: Oncology

## 2015-11-04 ENCOUNTER — Telehealth: Payer: Self-pay | Admitting: Oncology

## 2015-11-04 ENCOUNTER — Telehealth: Payer: Self-pay | Admitting: *Deleted

## 2015-11-04 DIAGNOSIS — G620 Drug-induced polyneuropathy: Secondary | ICD-10-CM | POA: Insufficient documentation

## 2015-11-04 DIAGNOSIS — T451X5A Adverse effect of antineoplastic and immunosuppressive drugs, initial encounter: Secondary | ICD-10-CM

## 2015-11-04 DIAGNOSIS — D6481 Anemia due to antineoplastic chemotherapy: Secondary | ICD-10-CM | POA: Insufficient documentation

## 2015-11-04 DIAGNOSIS — C519 Malignant neoplasm of vulva, unspecified: Secondary | ICD-10-CM

## 2015-11-04 DIAGNOSIS — C7800 Secondary malignant neoplasm of unspecified lung: Secondary | ICD-10-CM

## 2015-11-04 NOTE — Telephone Encounter (Signed)
Dr Marko Plume I called the patient to make her a 12/19 appointment and advise her about scans and she doesn't think this is right.  I read her the note in the chart but she would like a call from the nurse or you today or next week. Thanks anne   Above is a message to dr Marko Plume to advise

## 2015-11-04 NOTE — Telephone Encounter (Signed)
Medical Oncology  Returned call to patient at her request after scheduler spoke with her. LM for her to call back to office if she has questions. If RN speaks with her, see my documentation note of 11-04-15 and Dr Serita Grit note 10-31-15.  Godfrey Pick, MD

## 2015-11-04 NOTE — Progress Notes (Signed)
Medical Oncology  Gyn onc recommendation for PET CT after cycle 3, then will see Dr Denman George again Jan 2017 after cycle 4. I have ordered CT CAP to start, can request PET preauthorization depending on CT findings.  I will see her ~ week of Dec 19, can coordinate CTs with that visit if she prefers/ if possible.  Godfrey Pick, MD

## 2015-11-04 NOTE — Telephone Encounter (Signed)
FYI " I'm getting mixed messages from my doctors.  Dr. Denman George wants a PET scan after I complete three cycles of treatment.  Dr. Marko Plume wants CT scans in December.  I don't know if I'm available because of Lymphedema Clinic appointments.  What is the difference between the test and why do it now?"  Advised Pet will be ordered pending CT results.  Also informed her of the difference between images and different views verses hot spots with PET.  Insurance may may be need CT results before authorizing a PET.  Provided Central Scheduling phone number to call next week to coordinate appointments.  Thanked me for helping clarify.

## 2015-11-04 NOTE — Progress Notes (Signed)
OFFICE PROGRESS NOTE   October 31, 2015  Physicians:D.ClarkePearson/ E.Emmit Alexanders (PCP [facility], [place] [phone]), Marguerita Merles (radiation oncology [facility]ond[facility] Allegra), Roslynn Amble [facility] cardiology [place] [phone] fax [phone]), Dorrene German (vascular surgery Mikel Cella) ([facility] gyn oncology [facility]) Adonis Housekeeper cardiovascular surgery Mikel Cella)  INTERVAL HISTORY:  Patient is seen, alone for visit, in continuing attention to chemotherapy in proces for progressive metastatic vulvar carcinoma, with cycle 2 CDDP taxol given 10-20-15. She has not needed gCSF to date. Plan restaging scans after cycle 3 Patient is to see Dr Denman George also 10-31-15 for follow up of the right radical vulvectomy done 07-26-15, that wound still being packed.  She has been referred to lymphedema PT in [facility] by Dr Erlene Quan, for the LE swelling.  Patient has had no pain in right inguinal area for last 2 days. The vulvectomy wound seems about the same to her, still packing and irrigating, no increased discomfort but not obviously smaller. Patient had more fatigue with cycle 2 chemo, beginning day 3. She has had no nausea since beginning medication for GERD; she is eating and drinking fluids. Taxol aches were generalized x 3 days, moreso bilateral wrists and left foot. She has slight tingling in fingers left hand, no other peripheral neuropathy symptoms. She had some diarrhea with 3-4 stools daily, took lomotil (left from radiation diarrhea) and now just 1 soft stool in past 24 hours. She has had no chest pain, no symptoms of CHF, did not need lasix.Peripheral IV access ok. No fever Remainder of 10 point review of systems unchanged/ negative.    Flu vaccine 09-05-15 No central catheter  ONCOLOGIC HISTORY Patient presented to PCP Feb 2016 with increasing right vulvar mass measuring 8 x 4 x 3 cm, biopsy with squamous cell carcinoma. PET had uptake in bilateral  inguinal nodes and 3 nonspecific lung nodules. US biopsy lymph node at [facility] 02-04-15 [phone]) documented metastatic squamous cell carcinoma. She received radiation by Dr Erlene Quan with sensitizing CDDP chemotherapy, the radiation given from 02-17-15 thru 04-19-15, which was 6300 cGy in 35 fractions to all PET avid disease and 4500 cGy in 25 fractions to all areas at risk of harboring microscopic disease including the pelvis. Chemotherapy records are not available at time of this visit. She had one break in therapy due to vulvitis, diarrhea and at least one episode of low potassium during treatment, but no significant nausea. Initial follow up showed good response, however she had ulcerative lesion on vulva by 07-06-15. Repeat PET 07-04-15 showed residual activity in the vulvar region, resolution of left inguinal nodes, decrease in right inguinal nodes (although some activity remains) and 2 new right external iliac nodes, 3 pulmonary nodules stable with a new 5 mm pulmonary nodule. She was referred from Dr Erlene Quan to Dr Josephina Shih, with consultation on 07-15-15, recommendation for radical resection of right vulvar lesion as palliative procedure, with plan to follow up the external iliac node and possible pulmonary lesions after surgery. She had extended right radical vulvectomy 07-26-15 at [facility]; final pathology [phone] [id number]) showed G3 poorly differentiated squamous cell carcinoma with negative margins. She had post op follow up by Dr Josephina Shih on 08-05-15, with perineal wound separation then; she subsequently saw Dr Denman George on 9-9, 9-19 and again today. PET at [facility] 08-23-15 had uptake in vulvar area possibly surgical, increase in RUL lung nodule from 5 mm to 7 mm now hypermetabolic with SUV 2.7, new hypermetabolic left inguinal node, increase in hypermetabolic right inguinal  node and right obturator node, increase in size (?) or RLL lung nodule with other lung nodules stable, and stable hypermetabolic right  external iliac node. CDDP taxol began 09-29-15  Objective:  Vital signs in last 24 hours:  BP 124/67 mmHg  Pulse 106  Temp(Src) 98.2 F (36.8 C) (Oral)  Resp 18  Ht 5' 3"  (1.6 m)  Wt 165 lb 12.8 oz (75.206 kg)  BMI 29.38 kg/m2  SpO2 99% Weight down 2 lbs Alert, oriented and appropriate. Ambulatory without assistance.  Alopecia  HEENT:PERRL, sclerae not icteric. Oral mucosa moist without lesions, posterior pharynx clear.  Neck supple. No JVD.  Lymphatics:no cervical,supraclavicular adenopathy. Induration thru right inguinal area. Resp: clear to auscultation bilaterally and no dullne to percussion bilaterally Cardio: regular rate and rhythm. No gallop. GI: soft, nontender, not distended, no mass or organomegaly. Some normal bowel sounds. Musculoskeletal/ Extremities: thighs appear generally symmetrical, without pitting edema, cords, tenderness Neuro: minimal peripheral neuropathy as noted. Otherwise nonfocal. Psych appropriate mood and affect Skin without rash, ecchymosis, petechiae   Lab Results:  Results for orders placed or performed in visit on 10/31/15  Comprehensive metabolic panel (Cmet) - [facility]  Result Value Ref Range   Sodium 137 136 - 145 mEq/L   Potassium 4.3 3.5 - 5.1 mEq/L   Chloride 102 98 - 109 mEq/L   CO2 25 22 - 29 mEq/L   Glucose 99 70 - 140 mg/dl   BUN 15.5 7.0 - 26.0 mg/dL   Creatinine 0.8 0.6 - 1.1 mg/dL   Total Bilirubin 0.40 0.20 - 1.20 mg/dL   Alkaline Phosphatase 58 40 - 150 U/L   AST 22 5 - 34 U/L   ALT 19 0 - 55 U/L   Total Protein 6.4 6.4 - 8.3 g/dL   Albumin 3.1 (L) 3.5 - 5.0 g/dL   Calcium 9.1 8.4 - 10.4 mg/dL   Anion Gap 10 3 - 11 mEq/L   EGFR 78 (L) >90 ml/min/1.73 m2  CBC with Differential  Result Value Ref Range   WBC 4.6 3.9 - 10.3 10e3/uL   NEUT# 2.8 1.5 - 6.5 10e3/uL   HGB 10.3 (L) 11.6 - 15.9 g/dL   HCT 31.8 (L) 34.8 - 46.6 %   Platelets 287 145 - 400 10e3/uL   MCV 86.4 79.5 - 101.0 fL   MCH 28.1 25.1 - 34.0 pg   MCHC 32.6  31.5 - 36.0 g/dL   RBC 3.67 (L) 3.70 - 5.45 10e6/uL   RDW 15.3 (H) 11.2 - 14.5 %   lymph# 0.8 (L) 0.9 - 3.3 10e3/uL   MONO# 0.8 0.1 - 0.9 10e3/uL   Eosinophils Absolute 0.2 0.0 - 0.5 10e3/uL   Basophils Absolute 0.0 0.0 - 0.1 10e3/uL   NEUT% 61.3 38.4 - 76.8 %   LYMPH% 17.1 14.0 - 49.7 %   MONO% 17.0 (H) 0.0 - 14.0 %   EOS% 3.7 0.0 - 7.0 %   BASO% 0.9 0.0 - 2.0 %  Magnesium  Result Value Ref Range   Magnesium 1.8 1.5 - 2.5 mg/dl     Studies/Results:  No results found.  Medications: I have reviewed the patient's current medications. Continue Pepcid AC two daily. Needs to let us know if diarrhea,, as would not routinely use lomotil now out from radiation. On Hemocyte/ ferrous fumarate.  DISCUSSION: all of interval history reviewed. Will give cycle 3 on 11-10-15 as long as ANC >.=1.5 and platelets >=100k and otherwise stable. She would like to change time of cycle 4 to  12-09-15 due to Christmas holiday.   Assessment/Plan: 1.Metastatic squamous cell carcinoma of vulva: local progression following maximal radiation with sensitizing chemotherapy given in [facility] thru 04-19-15, subsequent right radical vulvectomy at [facility] 07-26-15. Progressive pulmonary metastatic disease and node involvement. Wound healing slowly while on chemo. Manageable side effects of CDDP taxol thru present cycle 2, will give cycle 3 on 11-10-15. I will see her back between cycles 3 and 4. Follow up with Dr Denman George also today. 2.coronary artery disease post MI and 4 vessel CABG 2014, followed by Dr Daine Floras. Hyperlipidemia. Some transient pedal edema but no significant CHF symptoms with cisplatin hydration. 3.peripheral vascular disease with completely occluded right carotid and post left carotid endarterectomy 4.40 pack year tobacco discontinued 04-2013 5.flu vaccine 08-2015. Pneumonia vaccine 08-2015 6.GERD and nausea improved with Pepcid AC, continue. 7.never mammograms 8.never colonoscopy 9.no advance directives 10.  Orthopedic surgeries clavicle and left knee 11.social situation: lives an hour away, but requests treatment in [place]. Mild anemia related to surgery, chemo, chronic disease, blood draws. 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 15.hypoK, hypoMG and fluid overload/ CHF  with initial sensitizing CDDP: following closely for these problems. Supplementing K and Mg in IVF   All questions answered. Cycle 3 chemo orders confirmed. CC Drs Ulice Brilliant, Erlene Quan, Daine Floras. TIme spent 25 min including >50% counseling and coordination of care.   Gordy Levan, MD

## 2015-11-07 ENCOUNTER — Other Ambulatory Visit: Payer: Self-pay | Admitting: Oncology

## 2015-11-07 ENCOUNTER — Telehealth: Payer: Self-pay | Admitting: *Deleted

## 2015-11-07 ENCOUNTER — Telehealth: Payer: Self-pay | Admitting: Oncology

## 2015-11-07 NOTE — Telephone Encounter (Signed)
Medical Oncology  Phone call to this MD from PCP Dr Ragsdale's office, letting us know that patient being seen there today with diarrhea, C diff +. Not orthostatic, planning to draw labs at that office. Discussed outpatient vs inpatient initiation of care depending on status, hold chemo planned 11-10-15, requested CBC diff and chemistries be checked. Left direct # for my RN and pager for this MD.  POF done to move chemo from 12-1 to 12-15, which will allow 14 days antibiotic. Added MD ~ 12-8 + lab. Message to RN to check on her by phone on 11-29 and request labs from Dr Ulice Brilliant.  Godfrey Pick, MD

## 2015-11-07 NOTE — Telephone Encounter (Signed)
Message to [facility] to help with chemo and i will call the patient with all appts

## 2015-11-07 NOTE — Telephone Encounter (Signed)
Call received from [place] with [facility].  Patient has diarrhea.  Tested positive for C-diff.  Asked advice.  Call transferred to Dr. Marko Plume.

## 2015-11-08 ENCOUNTER — Telehealth: Payer: Self-pay | Admitting: *Deleted

## 2015-11-08 NOTE — Telephone Encounter (Signed)
Per staff message and POF I have scheduled appts. Advised scheduler of appts. JMW  

## 2015-11-08 NOTE — Telephone Encounter (Signed)
-----   Message from Gordy Levan, MD sent at 11/07/2015  4:50 PM EST ----- C diff by PCP today POF sent to cancel chemo 12-1, reschedule ~ 12-15 to allow 2 weeks treatment for C diff first Appointment with this MD added for ~ 12-8  Please check on patient by phone on 11-29. Ask how long oral antibiotic has been prescribed, ? Flagyl or other, remind her that she has to take this as prescribed, push po fluids, no imodium/ lomotil.  Let her know schedulers will be in touch with her as above.    Please request labs from Dr Ragsdale's office from 11-28, and thank that office for their help with her care  thanks

## 2015-11-08 NOTE — Telephone Encounter (Signed)
Called patient as noted below by Dr. Marko Plume. Patient states she was prescribed Flagyl 500mg  TID for 14 days. Pt reports her first dose was yesterday, 11/28, in the evening. Reminded patient to take medication as prescribed - she states she will take it when she gets up daily around 10am, then 6pm, and then between midnight and 2am as she stays up late at night. Patient states she has been drinking lots of water and Gatorade since seeing her PCP, Dr. Ulice Brilliant, yesterday. She is agreeable to not take imodium or lomotil.   Patient states she has already received a call from scheduling with updated appts. Reviewed appts again while on the phone - patient notified that treatment has been cancelled on 11/10/15 but Dr. Marko Plume will see her with labs on 12/8 starting at 1:30pm and that her treatment has been postponed to 11/24/15 (pending she responds to antibiotic and does not develop any new concerns).  Patient appreciative of the call and agreeable to call our office with any additional questions or concerns prior to scheduled appt.

## 2015-11-08 NOTE — Telephone Encounter (Signed)
Received copy of recent labs drawn 11/07/15 at Dr. Felicita Gage office. Copy placed on Dr. Mariana Kaufman desk to review and original send to HIM to be scanned into patient's chart.

## 2015-11-09 ENCOUNTER — Other Ambulatory Visit: Payer: Self-pay | Admitting: *Deleted

## 2015-11-09 ENCOUNTER — Telehealth: Payer: Self-pay | Admitting: Oncology

## 2015-11-09 DIAGNOSIS — C519 Malignant neoplasm of vulva, unspecified: Secondary | ICD-10-CM

## 2015-11-09 MED ORDER — FERROUS FUMARATE 325 (106 FE) MG PO TABS: ORAL_TABLET | ORAL | Status: AC

## 2015-11-09 NOTE — Telephone Encounter (Signed)
Called patient with added chemo for 12/15 and i did not get an answer,patient will get a new avs at 12/8 appointment

## 2015-11-10 ENCOUNTER — Encounter: Payer: Self-pay | Admitting: Oncology

## 2015-11-10 ENCOUNTER — Other Ambulatory Visit: Payer: Medicare Other

## 2015-11-10 ENCOUNTER — Ambulatory Visit: Payer: Medicare Other

## 2015-11-10 NOTE — Progress Notes (Signed)
Medical Oncology  Labs done by PCP 11-07-15 received and will be scanned into EMR: Patient found to have C diff also on 11-07-15  CMET Na 145, K 3.8, creat 0.95, LFTs WNL CBC   WBC 6.6, ANC 4.6, Hgb 10.5, plt 388  L.Marko Plume, MD

## 2015-11-16 ENCOUNTER — Other Ambulatory Visit: Payer: Self-pay | Admitting: Oncology

## 2015-11-17 ENCOUNTER — Other Ambulatory Visit (HOSPITAL_BASED_OUTPATIENT_CLINIC_OR_DEPARTMENT_OTHER): Payer: Medicare Other

## 2015-11-17 ENCOUNTER — Ambulatory Visit (HOSPITAL_BASED_OUTPATIENT_CLINIC_OR_DEPARTMENT_OTHER): Payer: Medicare Other | Admitting: Oncology

## 2015-11-17 ENCOUNTER — Encounter: Payer: Self-pay | Admitting: Oncology

## 2015-11-17 VITALS — BP 130/84 | HR 91 | Temp 97.7°F | Resp 18 | Ht 63.0 in | Wt 162.8 lb

## 2015-11-17 DIAGNOSIS — E876 Hypokalemia: Secondary | ICD-10-CM | POA: Diagnosis not present

## 2015-11-17 DIAGNOSIS — C778 Secondary and unspecified malignant neoplasm of lymph nodes of multiple regions: Secondary | ICD-10-CM

## 2015-11-17 DIAGNOSIS — T451X5A Adverse effect of antineoplastic and immunosuppressive drugs, initial encounter: Secondary | ICD-10-CM

## 2015-11-17 DIAGNOSIS — C519 Malignant neoplasm of vulva, unspecified: Secondary | ICD-10-CM | POA: Diagnosis present

## 2015-11-17 DIAGNOSIS — C7800 Secondary malignant neoplasm of unspecified lung: Secondary | ICD-10-CM

## 2015-11-17 DIAGNOSIS — A0472 Enterocolitis due to Clostridium difficile, not specified as recurrent: Secondary | ICD-10-CM | POA: Insufficient documentation

## 2015-11-17 DIAGNOSIS — G620 Drug-induced polyneuropathy: Secondary | ICD-10-CM

## 2015-11-17 DIAGNOSIS — D6481 Anemia due to antineoplastic chemotherapy: Secondary | ICD-10-CM | POA: Diagnosis not present

## 2015-11-17 DIAGNOSIS — A047 Enterocolitis due to Clostridium difficile: Secondary | ICD-10-CM

## 2015-11-17 LAB — COMPREHENSIVE METABOLIC PANEL
ALK PHOS: 47 U/L (ref 40–150)
ALT: 10 U/L (ref 0–55)
AST: 21 U/L (ref 5–34)
Albumin: 3.4 g/dL — ABNORMAL LOW (ref 3.5–5.0)
Anion Gap: 11 mEq/L (ref 3–11)
BUN: 15 mg/dL (ref 7.0–26.0)
CALCIUM: 9.3 mg/dL (ref 8.4–10.4)
CO2: 24 mEq/L (ref 22–29)
CREATININE: 1 mg/dL (ref 0.6–1.1)
Chloride: 105 mEq/L (ref 98–109)
EGFR: 62 mL/min/{1.73_m2} — ABNORMAL LOW (ref >90)
Glucose: 120 mg/dl (ref 70–140)
Potassium: 4 mEq/L (ref 3.5–5.1)
Sodium: 140 mEq/L (ref 136–145)
Total Bilirubin: 0.41 mg/dL (ref 0.20–1.20)
Total Protein: 6.9 g/dL (ref 6.4–8.3)

## 2015-11-17 LAB — CBC WITH DIFFERENTIAL/PLATELET
BASO%: 0.4 % (ref 0.0–2.0)
Basophils Absolute: 0 10*3/uL (ref 0.0–0.1)
EOS%: 2.9 % (ref 0.0–7.0)
Eosinophils Absolute: 0.2 10*3/uL (ref 0.0–0.5)
HCT: 34.9 % (ref 34.8–46.6)
HGB: 11.2 g/dL — ABNORMAL LOW (ref 11.6–15.9)
LYMPH%: 17.5 % (ref 14.0–49.7)
MCH: 29.3 pg (ref 25.1–34.0)
MCHC: 32.1 g/dL (ref 31.5–36.0)
MCV: 91.4 fL (ref 79.5–101.0)
MONO#: 0.7 10*3/uL (ref 0.1–0.9)
MONO%: 9.8 % (ref 0.0–14.0)
NEUT#: 4.7 10*3/uL (ref 1.5–6.5)
NEUT%: 69.4 % (ref 38.4–76.8)
PLATELETS: 276 10*3/uL (ref 145–400)
RBC: 3.82 10*6/uL (ref 3.70–5.45)
RDW: 17.1 % — ABNORMAL HIGH (ref 11.2–14.5)
WBC: 6.8 10*3/uL (ref 3.9–10.3)
lymph#: 1.2 10*3/uL (ref 0.9–3.3)

## 2015-11-17 LAB — MAGNESIUM: MAGNESIUM: 1.8 mg/dL (ref 1.5–2.5)

## 2015-11-17 LAB — IRON AND TIBC
%SAT: 37 % (ref 21–57)
IRON: 84 ug/dL (ref 41–142)
TIBC: 230 ug/dL — ABNORMAL LOW (ref 236–444)
UIBC: 146 ug/dL (ref 120–384)

## 2015-11-17 NOTE — Progress Notes (Signed)
OFFICE PROGRESS NOTE   November 17, 2015   Physicians:D.ClarkePearson/ E.Emmit Alexanders (PCP [facility], [place] [phone]), Marguerita Merles (radiation oncology Rondall Allegra), Roslynn Amble [facility] cardiology [place] [phone] fax [phone]), Dorrene German (vascular surgery Mikel Cella) ([facility] gyn oncology [facility]) Adonis Housekeeper cardiovascular surgery Mikel Cella)  INTERVAL HISTORY:   Patient is seen, alone for visit, in continuing attention to progressive metastatic vulvar carcinoma, for which she has been receiving chemotherapy following palliative right radical vulvectomy 07-26-15. Chemo has been held since cycle 2 CDDP taxol on 10-20-15 due to C diff colitis diagnosed by PCP on 11-08-15.  She is to see Dr Denman George next 12-16-15  Patient continues Flagyl, planned x 14 days, still several loose stools yesterday and none so far today. She is taking medication tid, has not missed doses. She is eating and drinking fluids. She knows not to use antidiarrheals with C diff infection.  She has begun lymphedema treatment for LE at [facility], no wrapping or massage yet. She can tell slight improvement in thighs now.  Right inguinal mass seems slightly smaller, always some discomfort tho that is improved at times, sometimes sharp pain. She continues dressings for surgical wound, no bleeding or drainage. No fever. No increased SOB or productive cough.  Remainder of 10 point Review of Systems negative.  Flu vaccine 09-05-15 No central catheter  ONCOLOGIC HISTORY Patient presented to PCP Feb 2016 with increasing right vulvar mass measuring 8 x 4 x 3 cm, biopsy with squamous cell carcinoma. PET had uptake in bilateral inguinal nodes and 3 nonspecific lung nodules. US biopsy lymph node at [facility] 02-04-15 [phone]) documented metastatic squamous cell carcinoma. She received radiation by Dr Erlene Quan with sensitizing CDDP chemotherapy, the radiation given  from 02-17-15 thru 04-19-15, which was 6300 cGy in 35 fractions to all PET avid disease and 4500 cGy in 25 fractions to all areas at risk of harboring microscopic disease including the pelvis. Chemotherapy records are not available at time of this visit. She had one break in therapy due to vulvitis, diarrhea and at least one episode of low potassium during treatment, but no significant nausea. Initial follow up showed good response, however she had ulcerative lesion on vulva by 07-06-15. Repeat PET 07-04-15 showed residual activity in the vulvar region, resolution of left inguinal nodes, decrease in right inguinal nodes (although some activity remains) and 2 new right external iliac nodes, 3 pulmonary nodules stable with a new 5 mm pulmonary nodule. She was referred from Dr Erlene Quan to Dr Josephina Shih, with consultation on 07-15-15, recommendation for radical resection of right vulvar lesion as palliative procedure, with plan to follow up the external iliac node and possible pulmonary lesions after surgery. She had extended right radical vulvectomy 07-26-15 at [facility]; final pathology [phone]) showed G3 poorly differentiated squamous cell carcinoma with negative margins. She had post op follow up by Dr Josephina Shih on 08-05-15, with perineal wound separation then; she subsequently saw Dr Denman George on 9-9, 9-19 and again today. PET at [facility] 08-23-15 had uptake in vulvar area possibly surgical, increase in RUL lung nodule from 5 mm to 7 mm now hypermetabolic with SUV 2.7, new hypermetabolic left inguinal node, increase in hypermetabolic right inguinal node and right obturator node, increase in size (?) or RLL lung nodule with other lung nodules stable, and stable hypermetabolic right external iliac node. CDDP taxol began 09-29-15. Cycle 3 delayed with C diff diarrhea diagnosed 11-08-15.    Objective:  Vital signs in last  24 hours:  BP 130/84 mmHg  Pulse 91  Temp(Src) 97.7 F (36.5 C) (Oral)  Resp 18  Ht 5' 3"  (1.6  m)  Wt 162 lb 12.8 oz (73.846 kg)  BMI 28.85 kg/m2  SpO2 100% Weight down 3 lbs. Alert, oriented and appropriate. Ambulatory without difficulty. Does not appear acutely ill. Respirations not labored RA  HEENT:PERRL, sclerae not icteric. Oral mucosa moist without lesions, posterior pharynx clear.  No JVD.  Lymphatics:no supraclavicular adenopathy. Hard right inguinal mass may be minimally smaller, tender to palpation. Resp: diminished BS otherwise clear to auscultation bilaterally  Cardio: regular rate and rhythm. No gallop. GI: soft, nontender, not distended, no mass or organomegaly. Normally active bowel sounds. Surgical incision not remarkable. Musculoskeletal/ Extremities: without pitting edema, cords, tenderness. Upper inner thighs slightly softer Neuro: no increased in peripheral neuropathy. Otherwise nonfocal. PSYCH appropriate mood and affect Skin without rash, ecchymosis, petechiae   Lab Results:  Results for orders placed or performed in visit on 11/17/15  CBC with Differential  Result Value Ref Range   WBC 6.8 3.9 - 10.3 10e3/uL   NEUT# 4.7 1.5 - 6.5 10e3/uL   HGB 11.2 (L) 11.6 - 15.9 g/dL   HCT 34.9 34.8 - 46.6 %   Platelets 276 145 - 400 10e3/uL   MCV 91.4 79.5 - 101.0 fL   MCH 29.3 25.1 - 34.0 pg   MCHC 32.1 31.5 - 36.0 g/dL   RBC 3.82 3.70 - 5.45 10e6/uL   RDW 17.1 (H) 11.2 - 14.5 %   lymph# 1.2 0.9 - 3.3 10e3/uL   MONO# 0.7 0.1 - 0.9 10e3/uL   Eosinophils Absolute 0.2 0.0 - 0.5 10e3/uL   Basophils Absolute 0.0 0.0 - 0.1 10e3/uL   NEUT% 69.4 38.4 - 76.8 %   LYMPH% 17.5 14.0 - 49.7 %   MONO% 9.8 0.0 - 14.0 %   EOS% 2.9 0.0 - 7.0 %   BASO% 0.4 0.0 - 2.0 %  Magnesium - [facility]  Result Value Ref Range   Magnesium 1.8 1.5 - 2.5 mg/dl  Comprehensive metabolic panel  Result Value Ref Range   Sodium 140 136 - 145 mEq/L   Potassium 4.0 3.5 - 5.1 mEq/L   Chloride 105 98 - 109 mEq/L   CO2 24 22 - 29 mEq/L   Glucose 120 70 - 140 mg/dl   BUN 15.0 7.0 - 26.0 mg/dL    Creatinine 1.0 0.6 - 1.1 mg/dL   Total Bilirubin 0.41 0.20 - 1.20 mg/dL   Alkaline Phosphatase 47 40 - 150 U/L   AST 21 5 - 34 U/L   ALT 10 0 - 55 U/L   Total Protein 6.9 6.4 - 8.3 g/dL   Albumin 3.4 (L) 3.5 - 5.0 g/dL   Calcium 9.3 8.4 - 10.4 mg/dL   Anion Gap 11 3 - 11 mEq/L   EGFR 62 (L) >90 ml/min/1.73 m2  Iron and TIBC  Result Value Ref Range   Iron 84 41 - 142 ug/dL   TIBC 230 (L) 236 - 444 ug/dL   UIBC 146 120 - 384 ug/dL   %SAT 37 21 - 57 %     Studies/Results:  No results found.  Medications: I have reviewed the patient's current medications. Continues ferrous fumarate and famotidine  DISCUSSION C diff needs to be resolved prior to continuing chemotherapy. Patient is to speak with RN 12-13 or 12-14 prior to scheduled cycle 3 chemo which was moved from 12-1 to 12-15. If still diarrhea, needs additional treatment  and further delay of this palliative chemo.   Will move CT chest to early Jan, just before Dr Serita Grit visit on Jan 6.. This scan is intended to be after 3 cycles of chemo, moved due to delay in cycle 3 now.  Assessment/Plan: 1.Metastatic squamous cell carcinoma of vulva: local progression following maximal radiation with sensitizing chemotherapy given in [facility] thru 04-19-15, subsequent right radical vulvectomy at [facility] 07-26-15. Progressive pulmonary metastatic disease and node involvement. Wound healing slowly while on chemo. Cycle 3 delayed from 11-10-15 due to C diff colitis, possible treatment on 11-24-15 if diarrhea has resolved, RN to speak with her by phone next week prior to confirming treatment.  2. C diff colitis: continuing first 14 day course Flagyl begun 11-08-15. May be slightly improved but still several stools in last 24 hours. I have told patient that this may require longer treatment and may relapse, with treatment of this problem the priority over chemo at present. 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  Hyperlipidemia. Some transient pedal edema but no significant CHF symptoms with cisplatin hydration. 4.peripheral vascular disease with completely occluded right carotid and post left carotid endarterectomy 5.flu vaccine 08-2015. Pneumonia vaccine 08-2015 6.GERD and nausea improved with Pepcid AC, continue. 7.never mammograms 8.never colonoscopy 9.no advance directives 10. Orthopedic surgeries clavicle and left knee 11.social situation: lives an hour away, but requests treatment in [place]. Mild anemia related to surgery, chemo, chronic disease, blood draws. continue ferrous fumarate. 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 15.hypoK, hypoMG and fluid overload/ CHF with initial sensitizing CDDP: following closely for these problems. Supplementing K and Mg in IVF  16.40 pack year tobacco DCd 04-2013   All questions answered. Message to RN to follow up by phone next week; may need to adjust other apts in addition to chemo and CT if further delays. Time spent 30 min including >50% counseling and coordination of care. Cc Dr Ulice Brilliant, Dr Mauricio Po, MD   11/17/2015, 2:59 PM

## 2015-11-18 ENCOUNTER — Telehealth: Payer: Self-pay

## 2015-11-18 NOTE — Telephone Encounter (Signed)
-----   Message from Gordy Levan, MD sent at 11/18/2015  8:52 AM EST ----- Labs seen and need follow up: please let her know iron levels are ok. Still probably a good idea to take the iron tablets 2-3x weekly, but does not need to take these every day now

## 2015-11-18 NOTE — Telephone Encounter (Signed)
S/w pt about Dr Edwyna Shell message. She spoke back "2-3 times a week".

## 2015-11-19 ENCOUNTER — Other Ambulatory Visit: Payer: Self-pay | Admitting: Oncology

## 2015-11-22 ENCOUNTER — Telehealth: Payer: Self-pay

## 2015-11-22 DIAGNOSIS — A0472 Enterocolitis due to Clostridium difficile, not specified as recurrent: Secondary | ICD-10-CM

## 2015-11-22 NOTE — Telephone Encounter (Signed)
Re Flagyl  Sounds as if she has improved with Flagyl, and it may take longer to resolve in her with recent chemo. I would like her to take an additional week of Flagyl now and will hold off on next chemo a little longer as we said.  Flagyl is preferred for treatment of initial infection, usually 10-14 days but an additional week if other circumstances, and I think the extra week is best with active chemo and still some symptoms.   If she has recurrent symptomatic disease after initial treatment, then oral vancomycin.   Thank you

## 2015-11-22 NOTE — Telephone Encounter (Signed)
-----   Message from Gordy Levan, MD sent at 11/19/2015 10:18 AM EST ----- RN please check on her by phone on 12-13 or 12-14. If still diarrhea will NOT treat on 12-15 (CDDP taxol) and would give additional week of Flagyl 500 mg tid. If diarrhea has completely resolved x >=72  hours ok to treat.  Presently scheduled for chemo 12-15, LL 12-22 and LL 12-27 So will need to adjust these if delay  (I already sent POF to move CT from 12-19 to early Jan just before Denman George and to cancel Rx on 12-30)  Thank you

## 2015-11-22 NOTE — Telephone Encounter (Addendum)
Spoke with [name].  She had diarrhea at 10 pm Monday12-12-16 .   [name] remains foul smelling. Today she had a soft more formed stool ~430 pm.  She will complete Flagyl Thursday am (11-24-15). [name] states that she goes a day or two with out diarrhea and then will have diarrhea start back up again for a couple of days. She is taking in ~32 oz of fluid a day.  She states that she does not feel dehydrated.   She is wondering if the flagyl is helping.  Told her that She will not be able to get her treatment on Thursday 11-24-15 as she will not  have been  free of diarrhea for 72 hrs per Dr. Mariana Kaufman parameters noted below. Appoints for 11-24-15 cancelled. Will discuss [name]'s concern about the effectiveness of the flagyl with Dr. Marko Plume prior to calling in another week of Flagyl as noted below by Dr. Marko Plume.

## 2015-11-23 MED ORDER — METRONIDAZOLE 500 MG PO TABS: 500.0000 mg | ORAL_TABLET | Freq: Three times a day (TID) | ORAL | Status: DC

## 2015-11-23 NOTE — Telephone Encounter (Signed)
Discussed the rationale for Flagyl as noted below by Dr. Marko Plume.  Amy Mayer is agreeable to plan. Amy Mayer stated that she has had 3 diarrheal stools today so far.  She has Gatorade in the refrigerator.   Encouraged her to call Dr. Ulice Brilliant  To receive IVF if she feels more tired or washed out.  She said she will keep that in mind. Told her to keep follow up appointment with Dr. Marko Plume on 12-01-15.  Will notify patient if this appointment will be adjusted with chemotherapy being held.

## 2015-11-24 ENCOUNTER — Other Ambulatory Visit: Payer: Medicare Other

## 2015-11-24 ENCOUNTER — Ambulatory Visit: Payer: Medicare Other

## 2015-11-24 ENCOUNTER — Telehealth: Payer: Self-pay | Admitting: Oncology

## 2015-11-24 NOTE — Telephone Encounter (Signed)
[name] will see Dr. Marko Plume on the 12-06-15 with a tentative treatment date of 12-09-15. Amy Mayer feels better today.  She forced herself to drink 64 oz of fluid and it made a be difference. She went to Dr. Ulice Brilliant today and requested that her stool be tested for C-diff and other organisms.  Dr. Ulice Brilliant sent  4 different stool cultures/samples. Gave [name] fax number [phone] to have results sent to Dr. Marko Plume. [name] wanted Dr. Marko Plume to know that the lymph node continues to be painless which is wonderful.  She is appreciative of Dr. Mariana Kaufman care.

## 2015-11-24 NOTE — Telephone Encounter (Signed)
s.w. pt and advised on DEc ct moved to 1.4.Marland KitchenMarland KitchenMarland KitchenMarland Kitchenpt ok and aware

## 2015-11-28 ENCOUNTER — Telehealth: Payer: Self-pay

## 2015-11-28 ENCOUNTER — Ambulatory Visit (HOSPITAL_COMMUNITY): Payer: Medicare Other

## 2015-11-28 NOTE — Telephone Encounter (Signed)
Preliminary stool lab report received. Called pt that c-diff is negative and other labs are still pending. Forwarded question to dr LL if the pt needs to continue her 2nd round of flagyl.

## 2015-11-28 NOTE — Telephone Encounter (Signed)
S/w pt that Dr Edwyna Shell said to stop flagyl

## 2015-12-01 ENCOUNTER — Ambulatory Visit: Payer: Medicare Other | Admitting: Oncology

## 2015-12-01 ENCOUNTER — Other Ambulatory Visit: Payer: Medicare Other

## 2015-12-05 ENCOUNTER — Other Ambulatory Visit: Payer: Self-pay | Admitting: Oncology

## 2015-12-06 ENCOUNTER — Other Ambulatory Visit (HOSPITAL_BASED_OUTPATIENT_CLINIC_OR_DEPARTMENT_OTHER): Payer: Medicare Other

## 2015-12-06 ENCOUNTER — Encounter: Payer: Self-pay | Admitting: Oncology

## 2015-12-06 ENCOUNTER — Ambulatory Visit (HOSPITAL_BASED_OUTPATIENT_CLINIC_OR_DEPARTMENT_OTHER): Payer: Medicare Other | Admitting: Oncology

## 2015-12-06 ENCOUNTER — Telehealth: Payer: Self-pay | Admitting: Oncology

## 2015-12-06 VITALS — BP 134/78 | HR 104 | Temp 98.1°F | Resp 18 | Ht 63.0 in | Wt 159.6 lb

## 2015-12-06 DIAGNOSIS — C519 Malignant neoplasm of vulva, unspecified: Secondary | ICD-10-CM

## 2015-12-06 DIAGNOSIS — D6481 Anemia due to antineoplastic chemotherapy: Secondary | ICD-10-CM

## 2015-12-06 DIAGNOSIS — C7800 Secondary malignant neoplasm of unspecified lung: Secondary | ICD-10-CM | POA: Diagnosis not present

## 2015-12-06 DIAGNOSIS — E876 Hypokalemia: Secondary | ICD-10-CM

## 2015-12-06 DIAGNOSIS — C7801 Secondary malignant neoplasm of right lung: Secondary | ICD-10-CM

## 2015-12-06 DIAGNOSIS — G893 Neoplasm related pain (acute) (chronic): Secondary | ICD-10-CM

## 2015-12-06 DIAGNOSIS — L989 Disorder of the skin and subcutaneous tissue, unspecified: Secondary | ICD-10-CM

## 2015-12-06 LAB — CBC WITH DIFFERENTIAL/PLATELET
BASO%: 0.6 % (ref 0.0–2.0)
BASOS ABS: 0 10*3/uL (ref 0.0–0.1)
EOS ABS: 0.1 10*3/uL (ref 0.0–0.5)
EOS%: 1.6 % (ref 0.0–7.0)
HEMATOCRIT: 36.2 % (ref 34.8–46.6)
HGB: 11.8 g/dL (ref 11.6–15.9)
LYMPH%: 12.4 % — AB (ref 14.0–49.7)
MCH: 29.5 pg (ref 25.1–34.0)
MCHC: 32.7 g/dL (ref 31.5–36.0)
MCV: 90.2 fL (ref 79.5–101.0)
MONO#: 0.8 10*3/uL (ref 0.1–0.9)
MONO%: 9.7 % (ref 0.0–14.0)
NEUT#: 5.9 10*3/uL (ref 1.5–6.5)
NEUT%: 75.7 % (ref 38.4–76.8)
Platelets: 308 10*3/uL (ref 145–400)
RBC: 4.02 10*6/uL (ref 3.70–5.45)
RDW: 17.7 % — ABNORMAL HIGH (ref 11.2–14.5)
WBC: 7.8 10*3/uL (ref 3.9–10.3)
lymph#: 1 10*3/uL (ref 0.9–3.3)

## 2015-12-06 LAB — COMPREHENSIVE METABOLIC PANEL
ALBUMIN: 3.4 g/dL — AB (ref 3.5–5.0)
ALK PHOS: 46 U/L (ref 40–150)
ALT: 13 U/L (ref 0–55)
AST: 20 U/L (ref 5–34)
Anion Gap: 11 mEq/L (ref 3–11)
BUN: 12.4 mg/dL (ref 7.0–26.0)
CALCIUM: 9.3 mg/dL (ref 8.4–10.4)
CO2: 25 mEq/L (ref 22–29)
CREATININE: 0.8 mg/dL (ref 0.6–1.1)
Chloride: 102 mEq/L (ref 98–109)
EGFR: 72 mL/min/{1.73_m2} — ABNORMAL LOW (ref >90)
Glucose: 103 mg/dl (ref 70–140)
Potassium: 4 mEq/L (ref 3.5–5.1)
Sodium: 138 mEq/L (ref 136–145)
TOTAL PROTEIN: 7.1 g/dL (ref 6.4–8.3)
Total Bilirubin: 0.36 mg/dL (ref 0.20–1.20)

## 2015-12-06 LAB — MAGNESIUM: Magnesium: 1.8 mg/dl (ref 1.5–2.5)

## 2015-12-06 MED ORDER — LIDOCAINE 5 % EX OINT: TOPICAL_OINTMENT | CUTANEOUS | Status: DC

## 2015-12-06 MED ORDER — HYDROCODONE-ACETAMINOPHEN 5-325 MG PO TABS: 1.0000 | ORAL_TABLET | Freq: Four times a day (QID) | ORAL | Status: DC | PRN

## 2015-12-06 NOTE — Telephone Encounter (Signed)
Appointments made and avs printed for patient °

## 2015-12-06 NOTE — Patient Instructions (Signed)
Will use lidocaine jelly mixed in desitin ointment to the raw areas inner right buttock - apply at least 3-x daily, or can use after each void or bowel movement. We will send prescription for the lidocaine jelly Need to keep the areas clean as with peri bottle or hand held shower after void and especially after bowel movements.

## 2015-12-06 NOTE — Progress Notes (Signed)
OFFICE PROGRESS NOTE   December 06, 2015   Physicians:D.ClarkePearson/ E.Emmit Alexanders (PCP [facility], [place] [phone]), Marguerita Merles (radiation oncology [facility] Allegra), Roslynn Amble [facility] cardiology [place] [phone] fax [phone]), Dorrene German (vascular surgery Mikel Cella) ([facility] gyn oncology [facility]) Adonis Housekeeper cardiovascular surgery Mikel Cella)  INTERVAL HISTORY:  Patient is seen, alone for visit, in continuing attention to chemotherapy in process for 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 Cycle 3 CDDP taxol has been delayed from 11-10-15 due to C diff diarrhea, now clinically resolved.   Patient has had more discomfort right inner buttock "where the boil was lanced" ( by Dr Ulice Brilliant after vulvectomy) for past 1-2 weeks. This is uncomfortable when she sits or with friction. She is keeping area clean with peri-bottle rinsing after voids and after bowel movements; she has used nothing topically there. She is more uncomfortable in right groin over last couple of weeks, no increased swelling LE. Appetite is poor without frank nausea or any vomiting; she dislikes supplement drinks as too sweet, will try Carnation Instant in milk. Yesterday had some breakfast,  ham and cheese sandwich, gatorade, water total in 24 hrs. She has had no watery diarrhea since ~ 17 days of flagyl, stopped then after repeat C diff test + other stool cultures negative at Dr Felicita Gage office. Bowels are now soft formed stool 2-3x daily, that frequency having been her usual x years. She denies increased SOB, chest pain, productive cough, fever. Urinary stream "goes everywhere" since vulvectomy. No bleeding.  Remainder of 10 point Review of Systems negative.  Flu vaccine 09-05-15  No central catheter  She will see cardiologist Dr Daine Floras in Feb.    ONCOLOGIC HISTORY Patient  presented to PCP Feb 2016 with increasing right vulvar mass measuring 8 x 4 x 3 cm, biopsy with squamous cell carcinoma. PET had uptake in bilateral inguinal nodes and 3 nonspecific lung nodules. US biopsy lymph node at [facility] 02-04-15 [phone]) documented metastatic squamous cell carcinoma. She received radiation by Dr Erlene Quan with sensitizing CDDP chemotherapy, the radiation given from 02-17-15 thru 04-19-15, which was 6300 cGy in 35 fractions to all PET avid disease and 4500 cGy in 25 fractions to all areas at risk of harboring microscopic disease including the pelvis. Chemotherapy records are not available at time of this visit. She had one break in therapy due to vulvitis, diarrhea and at least one episode of low potassium during treatment, but no significant nausea. Initial follow up showed good response, however she had ulcerative lesion on vulva by 07-06-15. Repeat PET 07-04-15 showed residual activity in the vulvar region, resolution of left inguinal nodes, decrease in right inguinal nodes (although some activity remains) and 2 new right external iliac nodes, 3 pulmonary nodules stable with a new 5 mm pulmonary nodule. She was referred from Dr Erlene Quan to Dr Josephina Shih, with consultation on 07-15-15, recommendation for radical resection of right vulvar lesion as palliative procedure, with plan to follow up the external iliac node and possible pulmonary lesions after surgery. She had extended right radical vulvectomy 07-26-15 at [facility]; final pathology [id number]) showed G3 poorly differentiated squamous cell carcinoma with negative margins. She had post op follow up by Dr Josephina Shih on 08-05-15, with perineal wound separation then; she subsequently saw Dr Denman George on 9-9, 9-19 and again today. PET at [facility] 08-23-15 had uptake in vulvar area possibly surgical, increase in RUL lung nodule from 5  mm to 7 mm now hypermetabolic with SUV 2.7, new hypermetabolic left inguinal node, increase in hypermetabolic right  inguinal node and right obturator node, increase in size (?) or RLL lung nodule with other lung nodules stable, and stable hypermetabolic right external iliac node. CDDP taxol began 09-29-15. Cycle 3 delayed with C diff diarrhea diagnosed 11-08-15.  Objective:  Vital signs in last 24 hours:  BP 134/78 mmHg  Pulse 104  Temp(Src) 98.1 F (36.7 C) (Oral)  Resp 18  Ht _0  (1.6 m)  Wt 159 lb 9.6 oz (72.394 kg)  BMI 28.28 kg/m2  SpO2 99% Weight down 3.5 lbs.  Alert, oriented and appropriate. Ambulatory with some difficulty and uncomfortable sitting with the vulvar area.  Alopecia  HEENT:PERRL, sclerae not icteric. Oral mucosa moist without lesions, posterior pharynx clear.  Neck supple. No JVD.  Lymphatics:no cervical,suraclavicular, axillary or left inguinal adenopathy. Indurated fullness right inguinal area, somewhat tender, no skin breakdown. Resp: clear to auscultation bilaterally and normal percussion bilaterally Cardio: regular rate and rhythm. No gallop. GI: soft, nontender, not distended, no mass or organomegaly. Normally active bowel sounds.  Musculoskeletal/ Extremities: without pitting edema, cords, tenderness Neuro: no peripheral neuropathy. Otherwise nonfocal. PSYCH appropriate mood and affect Skin superficial round ulceration ~ 2 x 3 cm inner right buttock with adherent yellowish material, 2 small ulcerated areas laterally, no vesicles.   Lab Results:  Results for orders placed or performed in visit on 12/06/15  Magnesium - [facility]  Result Value Ref Range   Magnesium 1.8 1.5 - 2.5 mg/dl  CBC with Differential  Result Value Ref Range   WBC 7.8 3.9 - 10.3 10e3/uL   NEUT# 5.9 1.5 - 6.5 10e3/uL   HGB 11.8 11.6 - 15.9 g/dL   HCT 36.2 34.8 - 46.6 %   Platelets 308 145 - 400 10e3/uL   MCV 90.2 79.5 - 101.0 fL   MCH 29.5 25.1 - 34.0 pg   MCHC 32.7 31.5 - 36.0 g/dL   RBC 4.02 3.70 - 5.45 10e6/uL   RDW 17.7 (H) 11.2 - 14.5 %   lymph# 1.0 0.9 - 3.3 10e3/uL   MONO# 0.8 0.1  - 0.9 10e3/uL   Eosinophils Absolute 0.1 0.0 - 0.5 10e3/uL   Basophils Absolute 0.0 0.0 - 0.1 10e3/uL   NEUT% 75.7 38.4 - 76.8 %   LYMPH% 12.4 (L) 14.0 - 49.7 %   MONO% 9.7 0.0 - 14.0 %   EOS% 1.6 0.0 - 7.0 %   BASO% 0.6 0.0 - 2.0 %  Comprehensive metabolic panel  Result Value Ref Range   Sodium 138 136 - 145 mEq/L   Potassium 4.0 3.5 - 5.1 mEq/L   Chloride 102 98 - 109 mEq/L   CO2 25 22 - 29 mEq/L   Glucose 103 70 - 140 mg/dl   BUN 12.4 7.0 - 26.0 mg/dL   Creatinine 0.8 0.6 - 1.1 mg/dL   Total Bilirubin 0.36 0.20 - 1.20 mg/dL   Alkaline Phosphatase 46 40 - 150 U/L   AST 20 5 - 34 U/L   ALT 13 0 - 55 U/L   Total Protein 7.1 6.4 - 8.3 g/dL   Albumin 3.4 (L) 3.5 - 5.0 g/dL   Calcium 9.3 8.4 - 10.4 mg/dL   Anion Gap 11 3 - 11 mEq/L   EGFR 72 (L) >90 ml/min/1.73 m2    Mg 1.8  Labs from Dr Ulice Brilliant dated 11-24-15, will be scanned into this EMR: C diff negative Salmonella, shigella, campylobacter, E coli  Shiga, O&P all negative   Studies/Results:  No results found.  Medications: I have reviewed the patient's current medications. Hydrocodone refilled, certainly can use more than 1/2 tab at hs if needed. Script to lidocaine jelly to mix with desitin / zinc oxide and keep on ulcerated areas inner right buttock.  DISCUSSION Areas right inner buttock likely metastatic from the vulvar cancer. She is washing with peri bottle after uses bathroom and will begin lidocaine mixed in desitin/ zinc oxide at least 3x daily. Dr Denman George will evaluate at upcoming appointment.  We have decided to proceed with cycle 3 CDDP taxol as planned on 12-09-15, as she has thought some (transient) improvement in right groin involvement with first 2 cycles, and has had delay in cycle 3 due to recent C diff. She will have scans on 1-4 and will see Dr Denman George on 12-16-15.  Discussed diet. She dislikes supplements "too sweet", has not tried [employer] will do that, suggested protein powder.  She knows to  contact physician if any recurrent diarrhea, understands that C diff can be hard to eradicate and that follow up cultures may not be entirely accurate.   Assessment/Plan:  1.Metastatic squamous cell carcinoma of vulva: local progression following maximal radiation with sensitizing chemotherapy given in [facility] thru 04-19-15, subsequent palliative right radical vulvectomy at [facility] 07-26-15. Progressive pulmonary metastatic disease and node involvement. Wound healing slowly while on chemo, probable metastatic involvement inner right buttock. Cycle 3 delayed from 11-10-15 due to C diff colitis, will be given on 12-09-15. She will have CT CAP on 12-13-14 prior to follow up with Dr Denman George on 12-15-14. I will see her 12-25-14 to follow up labs and if additional systemic treatment appropriate. 2. C diff colitis: continuing first 14 day course Flagyl begun 11-08-15. May be slightly improved but still several stools in last 24 hours. I have told patient that this may require longer treatment and may relapse, with treatment of this problem the priority over chemo at present. 3.lesions right inner buttock: suggest metastatic vulvar carcinoma. Hydrocodone refilled, patient understands that she can use this more frequently if needed. Viscous lidocaine with zinc oxide topical. Keep area clean. Dr Denman George will see at upcoming visit. 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 Hyperlipidemia. Some transient pedal edema but no significant CHF symptoms with cisplatin hydration. Peripheral vascular disease with completely occluded right carotid and post left carotid endarterectomy 5.flu vaccine 08-2015. Pneumonia vaccine 08-2015 6.GERD and nausea improved with Pepcid AC, continue. 7.never mammograms 8.never colonoscopy 9.no advance directives 10. Orthopedic surgeries clavicle and left knee 11.social situation: lives an hour away, but requests treatment in [place]. Mild anemia related  to surgery, chemo, chronic disease, blood draws. continue ferrous fumarate. 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 15.hypoK, hypoMG and fluid overload/ CHF with initial sensitizing CDDP: following closely for these problems. Supplementing K and Mg in IVF  16.40 pack year tobacco DCd 04-2013  Chemo orders confirmed. All questions answered. Time spent 25 min including >50% counseling and coordination of care. CC Drs Ulice Brilliant, Faythe Dingwall, MD   12/06/2015, 10:11 AM

## 2015-12-07 ENCOUNTER — Telehealth: Payer: Self-pay

## 2015-12-07 DIAGNOSIS — C7800 Secondary malignant neoplasm of unspecified lung: Secondary | ICD-10-CM | POA: Insufficient documentation

## 2015-12-07 DIAGNOSIS — C519 Malignant neoplasm of vulva, unspecified: Secondary | ICD-10-CM

## 2015-12-07 NOTE — Telephone Encounter (Signed)
LM for Amy Mayer to call back to triage and give details about the concerns with the lidicaine ointment to be mixed with zinc oxide or Desitin.

## 2015-12-08 ENCOUNTER — Ambulatory Visit: Payer: Medicare Other | Admitting: Oncology

## 2015-12-08 ENCOUNTER — Other Ambulatory Visit: Payer: Medicare Other

## 2015-12-08 MED ORDER — LIDOCAINE 4 % EX CREA: TOPICAL_CREAM | CUTANEOUS | Status: AC

## 2015-12-08 NOTE — Telephone Encounter (Signed)
The 5% lidocaine ointment is 165.00 with insurance.  She cannot  Pay this price. Spoke with patient's pharmacy.  There is a 4% cream 15 g tube that is 26.83.  Amy Mayer with fine with this price point and Dr. Marko Plume is fine with the medication. Will sent prescription to [facility] in [place].

## 2015-12-09 ENCOUNTER — Other Ambulatory Visit: Payer: Self-pay | Admitting: *Deleted

## 2015-12-09 ENCOUNTER — Ambulatory Visit: Payer: Medicare Other

## 2015-12-09 ENCOUNTER — Ambulatory Visit (HOSPITAL_BASED_OUTPATIENT_CLINIC_OR_DEPARTMENT_OTHER): Payer: Medicare Other

## 2015-12-09 VITALS — BP 169/84 | HR 95 | Temp 98.0°F | Resp 18

## 2015-12-09 DIAGNOSIS — C519 Malignant neoplasm of vulva, unspecified: Secondary | ICD-10-CM | POA: Diagnosis not present

## 2015-12-09 DIAGNOSIS — Z5111 Encounter for antineoplastic chemotherapy: Secondary | ICD-10-CM | POA: Diagnosis present

## 2015-12-09 MED ORDER — SODIUM CHLORIDE 0.9 % IV SOLN
Freq: Once | INTRAVENOUS | Status: AC
  Administered 2015-12-09: 11:00:00 via INTRAVENOUS
  Filled 2015-12-09: qty 5

## 2015-12-09 MED ORDER — SODIUM CHLORIDE 0.9 % IV SOLN
50.0000 mg/m2 | Freq: Once | INTRAVENOUS | Status: AC
  Administered 2015-12-09: 92 mg via INTRAVENOUS
  Filled 2015-12-09: qty 92

## 2015-12-09 MED ORDER — PALONOSETRON HCL INJECTION 0.25 MG/5ML
0.2500 mg | Freq: Once | INTRAVENOUS | Status: AC
  Administered 2015-12-09: 0.25 mg via INTRAVENOUS

## 2015-12-09 MED ORDER — FAMOTIDINE IN NACL 20-0.9 MG/50ML-% IV SOLN
20.0000 mg | Freq: Two times a day (BID) | INTRAVENOUS | Status: DC
  Administered 2015-12-09: 20 mg via INTRAVENOUS

## 2015-12-09 MED ORDER — OXYCODONE-ACETAMINOPHEN 5-325 MG PO TABS
ORAL_TABLET | ORAL | Status: AC
  Filled 2015-12-09: qty 1

## 2015-12-09 MED ORDER — SODIUM CHLORIDE 0.9 % IV SOLN
Freq: Once | INTRAVENOUS | Status: AC
  Administered 2015-12-09: 11:00:00 via INTRAVENOUS

## 2015-12-09 MED ORDER — POTASSIUM CHLORIDE 2 MEQ/ML IV SOLN
Freq: Once | INTRAVENOUS | Status: AC
  Administered 2015-12-09: 09:00:00 via INTRAVENOUS
  Filled 2015-12-09: qty 10

## 2015-12-09 MED ORDER — DIPHENHYDRAMINE HCL 50 MG/ML IJ SOLN
50.0000 mg | Freq: Once | INTRAMUSCULAR | Status: AC
  Administered 2015-12-09: 50 mg via INTRAVENOUS

## 2015-12-09 MED ORDER — PALONOSETRON HCL INJECTION 0.25 MG/5ML
INTRAVENOUS | Status: AC
  Filled 2015-12-09: qty 5

## 2015-12-09 MED ORDER — PACLITAXEL CHEMO INJECTION 300 MG/50ML
135.0000 mg/m2 | Freq: Once | INTRAVENOUS | Status: AC
  Administered 2015-12-09: 246 mg via INTRAVENOUS
  Filled 2015-12-09: qty 41

## 2015-12-09 MED ORDER — DIPHENHYDRAMINE HCL 50 MG/ML IJ SOLN
INTRAMUSCULAR | Status: AC
  Filled 2015-12-09: qty 1

## 2015-12-09 MED ORDER — FAMOTIDINE IN NACL 20-0.9 MG/50ML-% IV SOLN
INTRAVENOUS | Status: AC
  Filled 2015-12-09: qty 50

## 2015-12-09 MED ORDER — OXYCODONE-ACETAMINOPHEN 5-325 MG PO TABS
2.0000 | ORAL_TABLET | Freq: Once | ORAL | Status: AC
  Administered 2015-12-09: 1 via ORAL

## 2015-12-09 NOTE — Patient Instructions (Signed)
[facility] Discharge Instructions for Patients Receiving Chemotherapy  Today you received the following chemotherapy agents: Taxol and Cisplatin   To help prevent nausea and vomiting after your treatment, we encourage you to take your nausea medication as directed.   If you develop nausea and vomiting that is not controlled by your nausea medication, call the clinic.   BELOW ARE SYMPTOMS THAT SHOULD BE REPORTED IMMEDIATELY:  *FEVER GREATER THAN 100.5 F  *CHILLS WITH OR WITHOUT FEVER  NAUSEA AND VOMITING THAT IS NOT CONTROLLED WITH YOUR NAUSEA MEDICATION  *UNUSUAL SHORTNESS OF BREATH  *UNUSUAL BRUISING OR BLEEDING  TENDERNESS IN MOUTH AND THROAT WITH OR WITHOUT PRESENCE OF ULCERS  *URINARY PROBLEMS  *BOWEL PROBLEMS  UNUSUAL RASH Items with * indicate a potential emergency and should be followed up as soon as possible.  Feel free to call the clinic you have any questions or concerns. The clinic phone number is [phone].  Please show the CHEMO ALERT CARD at check-in to the Emergency Department and triage nurse.   

## 2015-12-12 ENCOUNTER — Other Ambulatory Visit: Payer: Self-pay | Admitting: Oncology

## 2015-12-14 ENCOUNTER — Encounter (HOSPITAL_COMMUNITY): Payer: Self-pay

## 2015-12-14 ENCOUNTER — Encounter: Payer: Self-pay | Admitting: General Practice

## 2015-12-14 ENCOUNTER — Encounter: Payer: Self-pay | Admitting: Hematology

## 2015-12-14 ENCOUNTER — Ambulatory Visit (HOSPITAL_COMMUNITY)
Admission: RE | Admit: 2015-12-14 | Discharge: 2015-12-14 | Disposition: A | Discharge status: Home / Self Care | Payer: Medicare Other | Source: Ambulatory Visit | Attending: Oncology | Admitting: Oncology

## 2015-12-14 DIAGNOSIS — J432 Centrilobular emphysema: Secondary | ICD-10-CM | POA: Insufficient documentation

## 2015-12-14 DIAGNOSIS — Z923 Personal history of irradiation: Secondary | ICD-10-CM | POA: Diagnosis not present

## 2015-12-14 DIAGNOSIS — Z9221 Personal history of antineoplastic chemotherapy: Secondary | ICD-10-CM | POA: Insufficient documentation

## 2015-12-14 DIAGNOSIS — C7800 Secondary malignant neoplasm of unspecified lung: Secondary | ICD-10-CM | POA: Diagnosis present

## 2015-12-14 DIAGNOSIS — C519 Malignant neoplasm of vulva, unspecified: Secondary | ICD-10-CM | POA: Insufficient documentation

## 2015-12-14 DIAGNOSIS — Z9889 Other specified postprocedural states: Secondary | ICD-10-CM | POA: Insufficient documentation

## 2015-12-14 DIAGNOSIS — C8595 Non-Hodgkin lymphoma, unspecified, lymph nodes of inguinal region and lower limb: Secondary | ICD-10-CM | POA: Diagnosis not present

## 2015-12-14 MED ORDER — IOHEXOL 300 MG/ML  SOLN
100.0000 mL | Freq: Once | INTRAMUSCULAR | Status: AC | PRN
  Administered 2015-12-14: 100 mL via INTRAVENOUS

## 2015-12-14 NOTE — Progress Notes (Signed)
Spiritual Care Note  Missed Amy Mayer when she requested chaplain at last chemo (off campus), so returned call today.  Although she was feeling very tired, with bone pain that is lingering longer than with past tx rounds, she was motivated to take a call "from the minister of the [facility]," as her husband identified me.  We plan to meet following her appt with Dr Denman George on Friday 1/6.  [place], [place], [facility] Pager [phone] Voicemail  [phone]

## 2015-12-16 ENCOUNTER — Ambulatory Visit: Payer: Medicare Other

## 2015-12-16 ENCOUNTER — Encounter: Payer: Self-pay | Admitting: Gynecologic Oncology

## 2015-12-16 ENCOUNTER — Encounter: Payer: Self-pay | Admitting: General Practice

## 2015-12-16 ENCOUNTER — Ambulatory Visit: Payer: Medicare Other | Attending: Gynecologic Oncology | Admitting: Gynecologic Oncology

## 2015-12-16 VITALS — BP 116/81 | HR 91 | Temp 98.6°F | Resp 18 | Ht 63.0 in | Wt 156.3 lb

## 2015-12-16 DIAGNOSIS — L98419 Non-pressure chronic ulcer of buttock with unspecified severity: Secondary | ICD-10-CM

## 2015-12-16 DIAGNOSIS — C519 Malignant neoplasm of vulva, unspecified: Secondary | ICD-10-CM | POA: Diagnosis present

## 2015-12-16 MED ORDER — SULFAMETHOXAZOLE-TRIMETHOPRIM 800-160 MG PO TABS: 1.0000 | ORAL_TABLET | Freq: Two times a day (BID) | ORAL | Status: DC

## 2015-12-16 NOTE — Addendum Note (Signed)
Addended by: Everitt Amber C on: 12/16/2015 03:05 PM   Modules accepted: Orders

## 2015-12-16 NOTE — Progress Notes (Signed)
Followup Note: Gyn-Onc   Amy Mayer 70 y.o. female  Chief Complaint  Patient presents with  . Vulvar Cancer    follow up    Assessment :   Recurrent carcinoma of the vulva (right) status post radical vulvectomy on 07/26/2015. Perineal wound separation. Postoperative wound infection - healing.  Evidence of systemic disease on postop PET (pelvic lymphadenopathy, inguinal lymphadenopathy, increasing pulmonary nodule), s/p 3 cycles of salvage chemotherapy with cisplatin and paclitaxel. Apparently stable disease on CT imaging.   New lesions (ulcers) on right buttock. I have a low suspicion for malignant disease.  Plan:   Plan to continue cisplatin and paclitaxel (will compare images from PET on September 2016 to this CT). If buttock lesion is positive for malignancy, would consider changing therapy.  Followup staph swab of buttock. Empiric treatment with bactrim x 1 week.  She is doing well healing her vulvar wound. No longer needs packing.  HPI: 70 year old white married female seen in consultation at the request of Dr. Marguerita Merles (radiation oncologist at [facility], [place]) regarding management of recurrent vulvar carcinoma.  The patient initially presented in the spring of 2016 with an enlarging right vulvar mass involving the right vulva measured approximate 8 x 4 x 3 cm. Biopsy revealed squamous cell carcinoma. Patient underwent a PET scan showing uptake in bilateral inguinal nodes and 3 nonspecific lung nodules. She received definitive chemoradiation therapy. Radiation course extended between 02/17/2015 and 04/19/2015. She received a total of 6300 cGy in 35 fractions to all PET avid disease and 4500 cGy in 25 fractions to all areas at risk of harboring microscopic disease including the pelvis. She had one break in therapy due to vulvitis. Initial follow-up examination showed complete response although on July 27 Dr. Erlene Quan found a ulcerative lesion  consistent with recurrent disease. A subsequent PET scan showed some residual activity in the vulvar region, resolution of left inguinal nodes, decrease in right inguinal nodes (although some activity remains) and 2 new right external iliac nodes. The 3 pulmonary nodules are stable although there is a new 5 mm pulmonary nodule as well.  She underwent extended radical vulvectomy of the right vulvar lesion on 07/26/2015 at [facility]. All surgical margins were negative.  She developed a postoperative wound separation and cellulitis which was debrided and then treated with wet to dry dressings.   She saw her Radiation Oncologist, Dr Erlene Quan, in [place] in September, 2016 and reviewed her PET from [facility].  On 08/23/15 it demonstrated a new 14 x 8 mm left inguinal lymph node that was FDG avid. An increase in hypermetabolic right inguinal node measuring 17 x 15 mm that was FDG avid. An increase in the size of a right upper lobe lung nodule now measuring 7 mm (from 5 mm) that was FDG avid. Scattered additional small lung nodules were stable in size. There was right external iliac lymph node measuring 10 mm that was FDG avid. A right obturator lymph node measured 13 mm and was also FDG appendectomy. There was surrounding hypermetabolic activity in the region of the vulva.  The patient started adjuvant cisplatin, paclitaxel on 10/05/15. Cycle 3 was delayed to 12/09/15..  Interval Hx: Patient has extreme fatigue and bone pain on chemotherapy. She has developed right buttock ulcerated lesions that are extremely painful.   CT chest abdomen and pelvis on 12/14/2015 (after cycle 3) revealed a solid noncalcified 5 mm anterior right upper lobe pulmonary nodule, a 2 mm right upper lobe pulmonary nodule,  a calcified 2 mm left upper lobe pulmonary nodule, and a solid lobular 8 x 4 mm medial left lower lobe pulmonary nodule. There is clustered right external iliac lymphadenopathy with the largest node measuring  1.4 cm. There was irregular bulky clustered heterogeneously enhancing right inguinal lymphadenopathy with the largest right inguinal node measuring 2.6 cm.  Review of Systems:10 point review of systems is negative except as noted in interval history. Except for general malaise.  Vitals: Blood pressure 116/81, pulse 91, temperature 98.6 F (37 C), temperature source Oral, resp. rate 18, height 5\' 3"  (1.6 m), weight 156 lb 4.8 oz (70.897 kg), SpO2 97 %.  Physical Exam: General : The patient is a healthy woman in no acute distress.  HEENT: normocephalic, extraoccular movements normal; neck is supple without thyromegally  Lynphnodes: Supraclavicular. There is thickening in the inguinal regions (right>left) secondary to recent radiation therapy (the right is no longer tender) and "tanning" of the skin. Abdomen: Soft, non-tender, no ascites, no organomegally, no masses, no hernias  Pelvic:  EGBUS:  Status post radical vulvectomy. The advancement flaps seen to be healing well at the present time. However in the midline perineum there is separation of the wound.  Healing well. No debriding necessary. No packing required. Awaiting complete epithelialization.   3 ulcerated lesions on right medial buttock approximating vulva posteriorally. Fibrinous debris in base. Very tender. These were swabbed. The largest was biopsied on its raised outer rim.  Urethra and Bladder: Normal, non-tender  Cervix: Unable to visualize. Uterus: Could not evaluate.   Rectal: normal sphincter tone, no masses, no blood , the anus and rectum are free from the ulcerative lesion. Lower extremities: Mild lymphedema in the upper thighs.     Allergies  Allergen Reactions  . Aspirin Other (See Comments)    "Only uncoated" makes stomach upset  . Codeine Nausea And Vomiting  . Penicillins Hives  . Morphine Anxiety    Past Medical History  Diagnosis Date  . Arthritis   . Hypertension   . Myocardial infarction ([place]) 04/2013   . Hyperlipidemia   . Coronary artery disease   . Cataracts, bilateral   . Carotid stenosis     Past Surgical History  Procedure Laterality Date  . Tonsillectomy  1964  . Aortic valve replacement (avr)/coronary artery bypass grafting (cabg)  2014  . Carotid endarterectomy  10/18/2014  . Clavicle surgery  1997  . Cardiac catheterization      Current Outpatient Prescriptions  Medication Sig Dispense Refill  . aspirin EC 81 MG tablet Take 81 mg by mouth.    . cetirizine (ZYRTEC) 10 MG tablet Take 10 mg by mouth as needed. Reported on 12/06/2015    . Cholecalciferol (VITAMIN D-3) 1000 UNITS CAPS Take 1,000 Units by mouth daily.     Marland Kitchen dexamethasone (DECADRON) 4 MG tablet Take 5 tablets =20 mg  with food 12 hrs and 6 hrs prior to Taxol. 20 tablet 1  . famotidine (PEPCID) 20 MG tablet Take 20 mg by mouth daily.    . ferrous fumarate (HEMOCYTE - 106 MG FE) 325 (106 FE) MG TABS tablet Take 1 tablet daily with Vitamin C tablet. 30 each 2  . HYDROcodone-acetaminophen (NORCO) 5-325 MG tablet Take 1-2 tablets by mouth every 6 (six) hours as needed for moderate pain. 40 tablet 0  . lidocaine (LMX) 4 % cream Apply to affected area mixed with Desitin or zinc oxide as directed. 15 g 3  . Multiple Vitamin (THERA) TABS Take 1 tablet  by mouth daily.     . mupirocin ointment (BACTROBAN) 2 % Apply 1 application topically daily.    . Omega-3 Fatty Acids (FISH OIL) 1000 MG CAPS Take 2 capsules by mouth daily.     . ondansetron (ZOFRAN) 8 MG tablet Take by mouth every 8 (eight) hours as needed for nausea or vomiting.    . Potassium Gluconate 2 MEQ TABS Take 400 mg by mouth daily.     . pravastatin (PRAVACHOL) 80 MG tablet Take 80 mg by mouth daily.     . prochlorperazine (COMPAZINE) 10 MG tablet Reported on 12/06/2015    . vitamin C (ASCORBIC ACID) 500 MG tablet Take 500 mg by mouth daily. Reported on 12/06/2015    . LORazepam (ATIVAN) 0.5 MG tablet Take 0.25-0.5 mg by mouth every 6 (six) hours as needed.  Reported on 12/16/2015    . Magnesium 250 MG TABS Take 2 tablets by mouth daily. Reported on 12/16/2015    . traMADol (ULTRAM) 50 MG tablet Take 1-2 tablets (50-100 mg total) by mouth every 6 (six) hours as needed. (Patient not taking: Reported on 10/31/2015) 60 tablet 0   No current facility-administered medications for this visit.    Social History   Social History  . Marital Status: Married    Spouse Name: N/A  . Number of Children: N/A  . Years of Education: N/A   Occupational History  . Not on file.   Social History Main Topics  . Smoking status: Former Smoker -- 1.00 packs/day for 40 years    Quit date: 05/08/2013  . Smokeless tobacco: Not on file  . Alcohol Use: Yes     Comment: wine - several times a week  . Drug Use: No  . Sexual Activity: No   Other Topics Concern  . Not on file   Social History Narrative    Family History  Problem Relation Age of Onset  . Hypertension Father   . Coronary artery disease Father   . Alcoholism Father   . Diabetes Maternal Aunt   . Arthritis Mother   . Heart disease Brother     PROCEDURE NOTE: The patient provided them consent. The vulvar lesion was swabbed with Betadine. 1% lidocaine 1 mL was infiltrated into the medial aspect of the right buttock lesion. He to monitor punch biopsy was obtained from the site. Hemostasis was obtained with silver nitrate stick. Patient tolerated procedure well.  Donaciano Eva, MD 12/16/2015, 2:49 PM

## 2015-12-16 NOTE — Patient Instructions (Signed)
We will call you with the results of your biopsy and wound culture from today.  Plan to take Bactrim for one week.  Dr. Denman George will also speak with Dr. Marko Plume about comparing the CT images with the PET.

## 2015-12-16 NOTE — Addendum Note (Signed)
Addended by: Joylene John D on: 12/16/2015 03:11 PM   Modules accepted: Orders

## 2015-12-16 NOTE — Progress Notes (Signed)
Spiritual Care Note  Met with Amy Mayer and her husband following appt with Dr Denman George.  [name] is particularly struggling with three things:  1) very low energy, which drains morale, too; 2) the high-maintenance care of her vulnerable affected/infected area; 3) the dissonance between this very complicated health situation and her serious but generally resolved health hx (heart attack, etc).  Additionally, per pt, she heard God's assurance in her past two health crises, and she hasn't heard God this time, which exacerbates a sense of fear/vulnerability.  [name] values prayer and emotional support from family and friends.  She verbalized gratitude to her husband for specific types of care and support he provides to her.  Her central goal is to have increased energy in order to engage more fully with life.  Served as a witness to her story/hopes/struggle, normalizing feelings, providing reflective listening, and offering prayer.  Family verbalized deep appreciation for support.  We plan to f/u in chemo and/or by phone, but please also page as needs arise or circumstances change.  Thank you.  [place], [place], [facility] Pager [phone] Voicemail  [phone]

## 2015-12-20 LAB — WOUND CULTURE

## 2015-12-21 ENCOUNTER — Telehealth: Payer: Self-pay | Admitting: Gynecologic Oncology

## 2015-12-21 DIAGNOSIS — L089 Local infection of the skin and subcutaneous tissue, unspecified: Secondary | ICD-10-CM

## 2015-12-21 DIAGNOSIS — N9089 Other specified noninflammatory disorders of vulva and perineum: Secondary | ICD-10-CM

## 2015-12-21 DIAGNOSIS — B9689 Other specified bacterial agents as the cause of diseases classified elsewhere: Secondary | ICD-10-CM

## 2015-12-21 MED ORDER — CIPROFLOXACIN HCL 500 MG PO TABS: 500.0000 mg | ORAL_TABLET | Freq: Two times a day (BID) | ORAL | Status: DC

## 2015-12-21 NOTE — Telephone Encounter (Signed)
Patient informed of wound culture results and that the bactrim prescribed to her at her last visit will not be effective against this infection since the bacteria is resistant.  Advised we will send in Cipro 500 mg BID for 2 weeks per Dr. Denman George.  Advised to call for any questions or concerns.  Reportable signs and symptoms reviewed.

## 2015-12-26 ENCOUNTER — Ambulatory Visit (HOSPITAL_BASED_OUTPATIENT_CLINIC_OR_DEPARTMENT_OTHER): Payer: Medicare Other | Admitting: Oncology

## 2015-12-26 ENCOUNTER — Encounter: Payer: Self-pay | Admitting: Oncology

## 2015-12-26 ENCOUNTER — Telehealth: Payer: Self-pay | Admitting: Oncology

## 2015-12-26 ENCOUNTER — Other Ambulatory Visit: Payer: Medicare Other

## 2015-12-26 VITALS — BP 112/70 | HR 79 | Temp 97.7°F | Resp 18 | Ht 63.0 in | Wt 159.6 lb

## 2015-12-26 DIAGNOSIS — I878 Other specified disorders of veins: Secondary | ICD-10-CM

## 2015-12-26 DIAGNOSIS — L089 Local infection of the skin and subcutaneous tissue, unspecified: Secondary | ICD-10-CM

## 2015-12-26 DIAGNOSIS — C7800 Secondary malignant neoplasm of unspecified lung: Secondary | ICD-10-CM

## 2015-12-26 DIAGNOSIS — D6481 Anemia due to antineoplastic chemotherapy: Secondary | ICD-10-CM | POA: Diagnosis not present

## 2015-12-26 DIAGNOSIS — C775 Secondary and unspecified malignant neoplasm of intrapelvic lymph nodes: Secondary | ICD-10-CM

## 2015-12-26 DIAGNOSIS — C519 Malignant neoplasm of vulva, unspecified: Secondary | ICD-10-CM

## 2015-12-26 DIAGNOSIS — B9689 Other specified bacterial agents as the cause of diseases classified elsewhere: Secondary | ICD-10-CM

## 2015-12-26 DIAGNOSIS — G620 Drug-induced polyneuropathy: Secondary | ICD-10-CM

## 2015-12-26 DIAGNOSIS — T451X5A Adverse effect of antineoplastic and immunosuppressive drugs, initial encounter: Secondary | ICD-10-CM

## 2015-12-26 DIAGNOSIS — C7801 Secondary malignant neoplasm of right lung: Secondary | ICD-10-CM

## 2015-12-26 MED ORDER — METRONIDAZOLE 500 MG PO TABS: 500.0000 mg | ORAL_TABLET | Freq: Three times a day (TID) | ORAL | Status: DC

## 2015-12-26 MED ORDER — HYDROCODONE-ACETAMINOPHEN 5-325 MG PO TABS: ORAL_TABLET | ORAL | Status: DC

## 2015-12-26 NOTE — Progress Notes (Signed)
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 She had cycle 3 CDDP taxol on 12-09-15 (delay due to C diff) and restaging CT CAP 12-14-15. She saw Dr Denman George on 12-16-15 with culture and biopsy of ulcerated skin areas inner right buttock.   Last prior imaging was PET in [facility] system 08-23-15. CT from 12-14-15 shows 4 pulmonary nodules (5 mm RUL, 2 mm RUL, 2 mm LUL and 8x4 mm LLL), right external iliac nodes, bulky right inguinal nodes, liver and bones ok, other findings as in report below. The biopsy of buttocks ulcer did not show malignancy, culture with Klebsiella and Pseudomonas both sensitive to cipro which she began 12-21-15, planned for 2 weeks (initially had Bactrim x ~ 4 days, changed to cipro with sensitivities).  Dr Denman George recommended continuing CDDP taxol.   Patient is mostly uncomfortable from the ulcerated areas right inner buttock. She is keeping areas clean with peribottle and using desitin mixed with lidocaine topical. She felt well otherwise last week, with appetite ok, and energy fairly good. She has had no diarrhea, NOTE had C diff late Nov. She denies fever, bleeding, increased SOB or cough, new or different pain otherwise, swelling in LE. Lab was unable to draw blood today despite 2-3 attempts, peripheral IV access progressively more difficult No increased peripheral neuropathy.  Remainder of 10 point Review of Systems negative/ unchanged.    Flu vaccine  09-05-15  No central catheter  ONCOLOGIC HISTORY Patient presented to PCP Feb 2016 with increasing right vulvar mass measuring 8 x 4 x 3 cm, biopsy with squamous cell carcinoma. PET had uptake in bilateral inguinal nodes and 3 nonspecific lung nodules. US biopsy lymph node at [facility] 02-04-15 [phone]) documented metastatic squamous cell carcinoma. She received radiation by Dr Erlene Quan with sensitizing CDDP chemotherapy, the radiation given from 02-17-15 thru 04-19-15, which was 6300 cGy in 35 fractions to all PET avid disease and 4500 cGy in 25 fractions to all areas at risk of harboring microscopic disease including the pelvis. Chemotherapy records are not available at time of this visit. She had one break in therapy due to vulvitis, diarrhea and at least one episode of low potassium during treatment, but no significant nausea. Initial follow up showed good response, however she had ulcerative lesion on vulva by 07-06-15. Repeat PET 07-04-15 showed residual activity in the vulvar region, resolution of left inguinal nodes, decrease in right inguinal nodes (although some activity remains) and 2 new right external iliac nodes, 3 pulmonary nodules stable with a new 5 mm pulmonary nodule. She was referred from Dr Erlene Quan to Dr Josephina Shih, with consultation on 07-15-15, recommendation for radical resection of right vulvar lesion as palliative procedure, with plan to follow up the external iliac node and possible pulmonary lesions after surgery. She had extended right radical vulvectomy 07-26-15 at [facility]; final pathology [phone]) showed G3 poorly differentiated squamous cell carcinoma with negative margins. She had post op follow up by Dr Josephina Shih on 08-05-15, with perineal wound separation then; she subsequently saw Dr Denman George on 9-9, 9-19 and again today. PET at  [facility] 08-23-15 had uptake in vulvar area possibly surgical, increase in RUL lung nodule from 5 mm to 7 mm now hypermetabolic with SUV 2.7, new  hypermetabolic left inguinal node, increase in hypermetabolic right inguinal node and right obturator node, increase in size (?) or RLL lung nodule with other lung nodules stable, and stable hypermetabolic right external iliac node. CDDP taxol began 09-29-15. Cycle 3 delayed with C diff diarrhea diagnosed 11-08-15. CT AP 12-14-15 after 3 cycles showed 4 scattered pulmonary nodules up to 8 mm LLL and right inguinal/ right external iliac adenopathy    Objective:  Vital signs in last 24 hours:  BP 112/70 mmHg  Pulse 79  Temp(Src) 97.7 F (36.5 C) (Oral)  Resp 18  Ht 5\' 3"  (1.6 m)  Wt 159 lb 9.6 oz (72.394 kg)  BMI 28.28 kg/m2  SpO2 99% Weight up 3 lbs Alert, oriented and appropriate. Ambulatory without assistance. Looks mildly uncomfortable seated in exam room. Respirations not labored RA  HEENT:PERRL, sclerae not icteric. Oral mucosa moist without lesions, posterior pharynx clear.  Neck supple. No JVD.  Lymphatics:no cervical,supraclavicular adenopathy. Hard, rather fixed adenopathy right inguinal unchanged, tender to light touch, no heat/ drainage/ erythema Resp: diminished BS as baseline otherwise clear to auscultation bilaterally and no dullness to percussion bilaterally Cardio: regular rate and rhythm. No gallop. GI: soft, nontender, not distended, no mass or organomegaly. A few bowel sounds. Surgical incision not remarkable. Musculoskeletal/ Extremities: without pitting edema, cords, tenderness Neuro: no change peripheral neuropathy. Otherwise nonfocal. PSYCH appropriate mood and affect Skin superficial oval ulceration medially in intergluteal area on right, 2x 3 cm with whitish exudate covering, no significant surrounding erythema and no fluctuance, biopsy site note. 2 other smaller areas of ulceration out onto right buttock each ~ 1.5 cm diameter Skin otherwise without rash, ecchymosis, petechiae    PATHOLOGY Lab Results: HIBAQ, NAKAO Collected: 12/16/2015 Client: [facility] Accession: [id number] Received: 12/17/2015 Everitt Amber, MD REPORT OF SURGICAL PATHOLOGYNAL DIAGNOSIS Diagnosis Skin-Punch, Right buttock - MARKED STROMAL ATYPIA, SEE COMMENT. - EPIDERMAL HYPERPLASIA AND ACUTE INFLAMMATION.   Studies/Results:  EXAM: CT CHEST, ABDOMEN, AND PELVIS WITH CONTRAST  12-14-15  COMPARISON: None.  FINDINGS: CT CHEST  Mediastinum/Nodes: Normal heart size. No pericardial fluid/thickening. Left main, left anterior descending, left circumflex and right coronary atherosclerosis status post CABG with ascending aortic and left internal mammary bypass grafts. Great vessels are normal in course and caliber. No central pulmonary emboli. Normal visualized thyroid. Normal esophagus. No pathologically enlarged axillary, mediastinal or hilar lymph nodes.  Lungs/Pleura: No pneumothorax. No pleural effusion. Mild centrilobular emphysema and diffuse bronchial wall thickening. There is solid noncalcified 5 mm anterior right upper lobe pulmonary nodule (series 4/ image 15). There is a calcified 2 mm right upper lobe pulmonary nodule on series 4/image 20). A calcified 2 mm left upper lobe pulmonary nodule is seen on series 4/image 10). There is a solid lobular 8 x 4 mm (average 6 mm diameter) medial left lower lobe pulmonary nodule (series 4/image 30). No acute consolidative airspace disease.  Musculoskeletal: No aggressive appearing focal osseous lesions. Mild degenerative changes in the thoracic spine. Sternotomy wires are intact.  CT ABDOMEN AND PELVIS  Hepatobiliary: Normal liver with no liver mass. Normal gallbladder with no radiopaque cholelithiasis. No biliary ductal dilatation.  Pancreas: Normal, with no mass or duct dilation.  Spleen: Normal size. No mass.  Adrenals/Urinary Tract: Normal adrenals. Normal kidneys with no hydronephrosis and no renal mass. Normal bladder.  Stomach/Bowel: Grossly normal stomach. Normal  caliber small  bowel with no small bowel wall thickening. Normal appendix. Mild sigmoid diverticulosis. No definite colonic wall thickening or pericolonic fat stranding.  Vascular/Lymphatic: Atherosclerotic nonaneurysmal abdominal aorta. Patent portal, splenic, hepatic and renal veins. There is heterogeneously enhancing clustered right external iliac lymphadenopathy, with the largest right external iliac node measuring 1.4 cm (series 2/ image 93). There is irregular bulky clustered heterogeneously enhancing right inguinal lymphadenopathy, with the largest right inguinal node measuring 2.6 cm (series 2/ image 109). No pathologically enlarged lymph nodes in the abdomen.  Reproductive: There are postsurgical changes from right vulvar resection, with no discrete mass or fluid collection in the vulva or vagina. Grossly normal uterus. No adnexal mass.  Other: No pneumoperitoneum, ascites or focal fluid collection.  Musculoskeletal: No aggressive appearing focal osseous lesions. Mild degenerative changes in the lumbar spine.  IMPRESSION: 1. Clustered right external iliac and right inguinal nodal metastases. 2. Postsurgical changes in the right vulva, with no discrete vulvar or vaginal mass or fluid collection. 3. No evidence of metastatic disease in the abdomen. 4. Two subcentimeter pulmonary nodules, largest 6 mm in the left lower lobe, indeterminate, lung metastases not excluded, recommend comparison with any prior outside imaging. 5. Mild centrilobular emphysema and diffuse bronchial wall thickening, suggesting COPD.  Medications: I have reviewed the patient's current medications. Continue Cipro for 2 week course, continue liberal topical desitin with viscous lidocaine as barrier. Add flagyl while on cipro due to previous C Diff  DISCUSSION  Will wait to resume CDDP gemzar at least until cipro completes. I will see her back 1-26 with labs to reevaluate the skin areas, and will resume  treatment with cycle 4 on 01-06-16 if improvement in the local infection and otherwise stable.   Discussed PAC, which patient will consider, but has not agreed to this as yet; note will not be able to give chemo if unable to draw labs or if peripheral IVs are not adequate for treatment..  Best not to place central line while active infection.     Following visit,  report of PET from [facility] system 08-23-15 accessed thru [place]: "FINDINGS: There is what may represent a surgical defect in the region of the vulva. There is surrounding hypermetabolic activity with an SUV Max of 5.4.  New hypermetabolic 14 x 8 mm left inguinal node image 234. SUV Max of 7.5.  Increase in hypermetabolic right inguinal node. Currently seen on image 236 measuring 17 x 15 mm compared to 15 x 12 mm. SUV Max of 8.1.  7 mm right upper lobe nodule image 80 previously measured 5 mm. SUV Max is currently 2.7. Stable 3 mm right upper lobe nodule image 88. 3 mm right upper lobe nodule image 86 is stable stable 4 mm left upper lobe nodule image 74. There is a 7 mm left  lower lobe nodule image 119 which could merely be seen on the prior.  Right external iliac lymph node on image 211 currently measures 9 x 10 mm compared to 10 x 9 mm. Current SUV Max is 7.7.  A right obturator node on image 217 measures 13 x 9 mm compared to 8 x 5 mm. SUV Max of 7.0"  Note first CDDP Taxol 09-29-15 and cycle 3 delayed due to C diff.  Assessment/Plan:  1.Metastatic squamous cell carcinoma of vulva: local progression following maximal radiation with sensitizing chemotherapy given in [facility] thru 04-19-15, subsequent palliative right radical vulvectomy at [facility] 07-26-15. Progressive pulmonary metastatic disease and node involvement. Disease seems  mostly stable comparing present CT with PET report from Sept. Wound healing slowly while on chemo and ulcerated areas inner right buttock with klebsiella and pseudomonas but no malignancy found  on biopsy.  Will recheck ulcerations 1-26 and could treat cycle 4 on 1-27 if situation appropriate.  2. C diff colitis documented late Nov. No symptoms now, however with 2 weeks Cipro planned, I have recommended resuming Flagyl 500 mg tid for duration of the cipro. 3.poor peripheral IV access: lab could not draw today. Due to travel distance, [facility] would be much easier to manage than PICC. Patient considering PAC. 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 Hyperlipidemia. Some transient pedal edema but no significant CHF symptoms with cisplatin hydration. Peripheral vascular disease with completely occluded right carotid and post left carotid endarterectomy 5.flu vaccine 08-2015. Pneumonia vaccine 08-2015 6.GERD and nausea improved with Pepcid AC, continue. 7.never mammograms 8.never colonoscopy 9.no advance directives 10. Orthopedic surgeries clavicle and left knee 11.social situation: lives an hour away, but requests treatment in [place]. Mild anemia related to surgery, chemo, chronic disease, blood draws. continue ferrous fumarate. 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 15.hypoK, hypoMG and fluid overload/ CHF with initial sensitizing CDDP: following closely for these problems. Supplementing K and Mg in IVF  16.40 pack year tobacco DCd 04-2013  All questions answered and patient knows to call if concerns prior to next visit. Time spent 30 min including >50% counseling and coordination of care. Cc Drs Daralene Milch    Gordy Levan, MD   12/26/2015, 1:31 PM

## 2015-12-26 NOTE — Telephone Encounter (Signed)
Appointments made and avs pritned for pateint °

## 2015-12-30 ENCOUNTER — Ambulatory Visit: Payer: Medicare Other

## 2015-12-30 ENCOUNTER — Encounter: Payer: Medicare Other | Admitting: Nutrition

## 2016-01-01 DIAGNOSIS — C519 Malignant neoplasm of vulva, unspecified: Secondary | ICD-10-CM | POA: Insufficient documentation

## 2016-01-01 DIAGNOSIS — L089 Local infection of the skin and subcutaneous tissue, unspecified: Secondary | ICD-10-CM

## 2016-01-01 DIAGNOSIS — C7801 Secondary malignant neoplasm of right lung: Secondary | ICD-10-CM | POA: Insufficient documentation

## 2016-01-01 DIAGNOSIS — I878 Other specified disorders of veins: Secondary | ICD-10-CM | POA: Insufficient documentation

## 2016-01-01 DIAGNOSIS — B9689 Other specified bacterial agents as the cause of diseases classified elsewhere: Secondary | ICD-10-CM | POA: Insufficient documentation

## 2016-01-02 ENCOUNTER — Encounter: Payer: Self-pay | Admitting: Gynecologic Oncology

## 2016-01-02 NOTE — Progress Notes (Signed)
Gynecologic Oncology Multi-Disciplinary Disposition Conference Note  Date of the Conference: January 02, 2016  Patient Name: Amy Mayer  Referring Provider: Dr. Marguerita Merles Primary GYN Oncologist: Dr. Everitt Amber  Stage/Disposition:  Metastatic vulvar cancer with stable/partial response to therapy.  Disposition is to continue current chemotherapy.  Consider PET CT after three cycles.     This Multidisciplinary conference took place involving physicians from [place], [place], Radiation Oncology, Pathology, Radiology along with the Gynecologic Oncology Nurse Practitioner and RN.  Comprehensive assessment of the patient's malignancy, staging, need for surgery, chemotherapy, radiation therapy, and need for further testing were reviewed. Supportive measures, both inpatient and following discharge were also discussed. The recommended plan of care is documented. Greater than 35 minutes were spent correlating and coordinating this patient's care.

## 2016-01-03 ENCOUNTER — Telehealth: Payer: Self-pay

## 2016-01-03 NOTE — Telephone Encounter (Signed)
Patient called to update Dr Everitt Amber that she will be completing her Cipro today and that she was also placed in Flagyl for one week by Dr Marko Plume to "conteract" the Cipro . Patient states that her "ulcer" are improving and she feels she is on the "mend" , but is wondering if she needs to be placed on additional antibiotics . Patient states she able to sit now compared to a week ago , patient informed we will update Dr Everitt Amber and follow up with Dr Serita Grit recommendations. Patient states understanding , denies further questions at this time.

## 2016-01-04 ENCOUNTER — Other Ambulatory Visit: Payer: Self-pay | Admitting: Oncology

## 2016-01-05 ENCOUNTER — Other Ambulatory Visit: Payer: Medicare Other

## 2016-01-05 ENCOUNTER — Ambulatory Visit: Payer: Medicare Other | Admitting: Oncology

## 2016-01-05 ENCOUNTER — Telehealth: Payer: Self-pay

## 2016-01-05 DIAGNOSIS — L089 Local infection of the skin and subcutaneous tissue, unspecified: Secondary | ICD-10-CM

## 2016-01-05 DIAGNOSIS — B9689 Other specified bacterial agents as the cause of diseases classified elsewhere: Secondary | ICD-10-CM

## 2016-01-05 DIAGNOSIS — C519 Malignant neoplasm of vulva, unspecified: Secondary | ICD-10-CM

## 2016-01-05 DIAGNOSIS — N9089 Other specified noninflammatory disorders of vulva and perineum: Secondary | ICD-10-CM

## 2016-01-05 MED ORDER — METRONIDAZOLE 500 MG PO TABS: 500.0000 mg | ORAL_TABLET | Freq: Three times a day (TID) | ORAL | Status: DC

## 2016-01-05 MED ORDER — CIPROFLOXACIN HCL 500 MG PO TABS: 500.0000 mg | ORAL_TABLET | Freq: Two times a day (BID) | ORAL | Status: DC

## 2016-01-05 NOTE — Telephone Encounter (Signed)
Patient called to update Dr Everitt Amber that her "diarrhea" is back and that she cancelled her follow up visit with Dr Marko Plume today related to increased pain . Dr Everitt Amber updated with patient diarrhea is back "flow" blown now , orders received to refill her Cipro 500 MG PO BID for 2 weeks , QTY: 28 ,no refills , Flagyl 500 MG PO TID for 2 weeks , QTY: 42 , no refills . Patient instructed to start the antibiotics for the 2 week period and call with additional changes or concerns . The patient states understanding , denies further questions at this time ,but states her "pain" is "better" now.

## 2016-01-06 ENCOUNTER — Ambulatory Visit: Payer: Medicare Other

## 2016-01-06 ENCOUNTER — Encounter: Payer: Medicare Other | Admitting: Nutrition

## 2016-01-06 NOTE — Telephone Encounter (Signed)
Spoke with Amy Mayer  To folow up on her pain.  She began to use Oxycodone 5 mg 1 tab every 4 hours for pain instead of the norco 5-325 mg as she was concerned about the miligrams of Tylenol she was taking in a 24 hr period She said that the 1 tablet is only holding her for ~3 hours Told her that Amy Mayer said that she can take 1-2 oxycodone 5 mg tabs every 4-6 hours prn pain. Amy Mayer in [facility] can write her a prescription for pain med if she runs out prior to visit with Amy Mayer on 01-23-16 at 1100. Amy Mayer appreciated the follow up phone call.

## 2016-01-06 NOTE — Addendum Note (Signed)
Addended by: Baruch Merl on: 01/06/2016 04:35 PM   Modules accepted: Orders

## 2016-01-12 IMAGING — CT CT CHEST W/ CM
2 of 5 series · 15 of 46 positions shown, 17 images · IV contrast (OMNIPAQUE)
Comparison: None.

CLINICAL DATA: Recurrent metastatic vulvar carcinoma with lung
metastases status post radiation therapy, with ongoing chemotherapy,
presenting for restaging.

EXAM:
CT CHEST, ABDOMEN, AND PELVIS WITH CONTRAST
TECHNIQUE: Multidetector CT imaging of the chest, abdomen and pelvis was
performed following the standard protocol during bolus
administration of intravenous contrast.
CONTRAST:  100mL OMNIPAQUE IOHEXOL 300 MG/ML  SOLN

[Series 2: cap with st · axial · 0.80mm/px · z∈[-650,-65]mm · 12 of 131 slices shown, 14 images]
[im 7/131  soft-tissue]
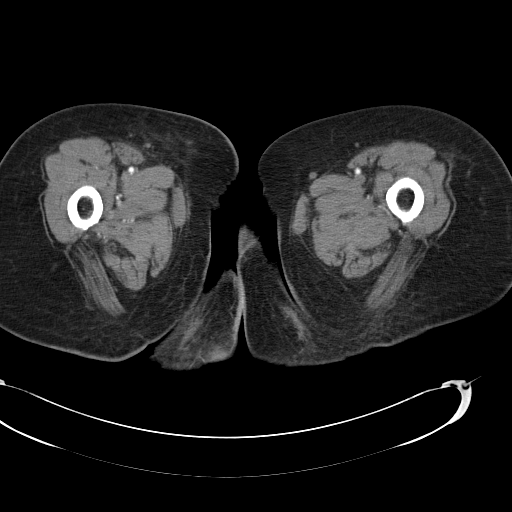
[im 7/131  bone]
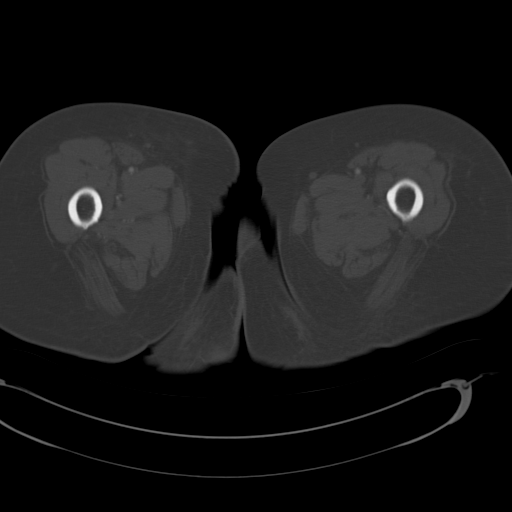
[im 21/131  soft-tissue]
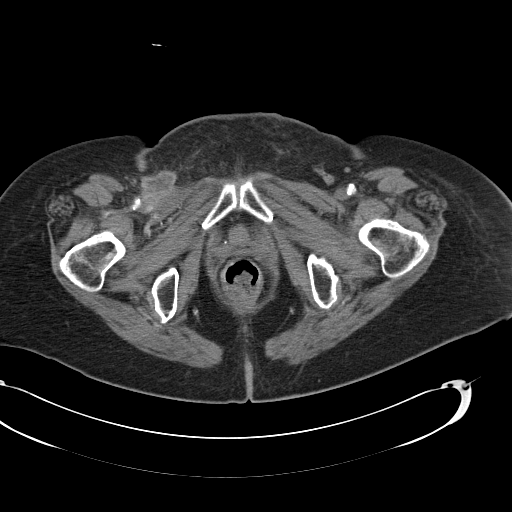
[im 28/131  soft-tissue]
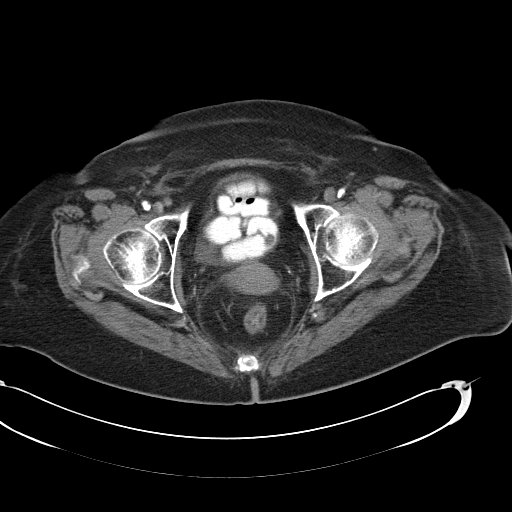
[im 42/131  soft-tissue]
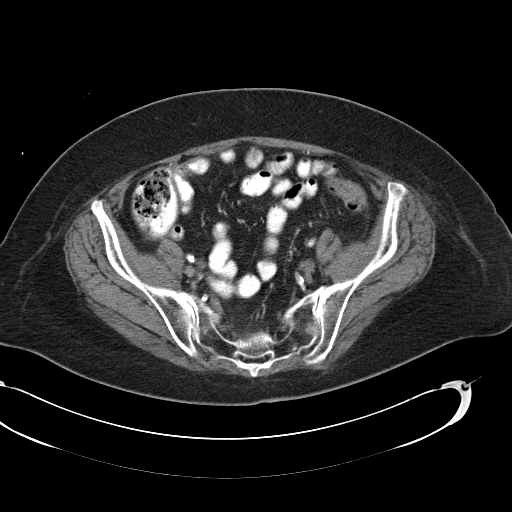
[im 48/131  soft-tissue]
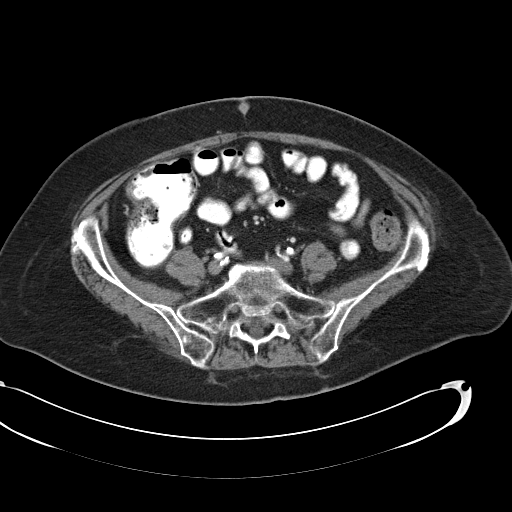
[im 62/131  soft-tissue]
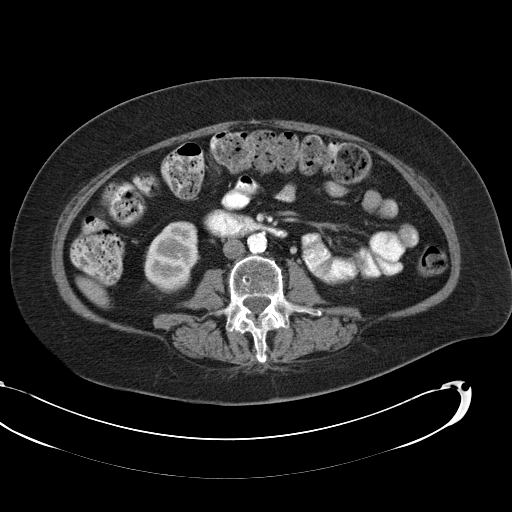
[im 69/131  soft-tissue]
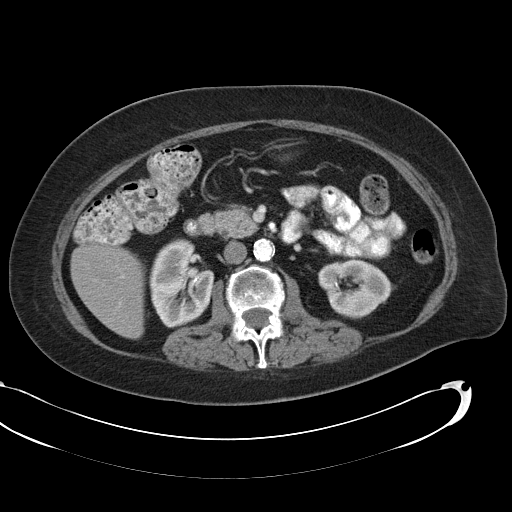
[im 83/131  soft-tissue]
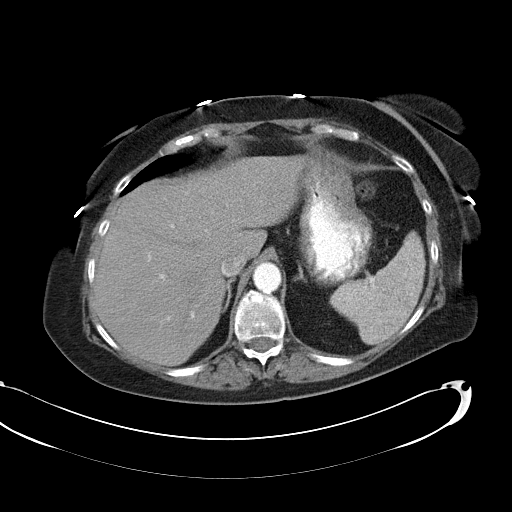
[im 89/131  soft-tissue]
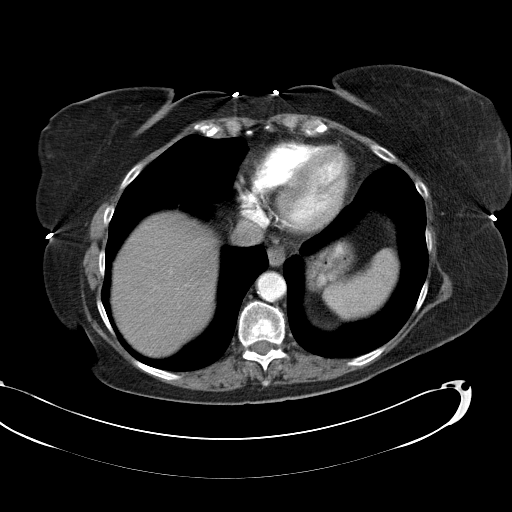
[im 89/131  bone]
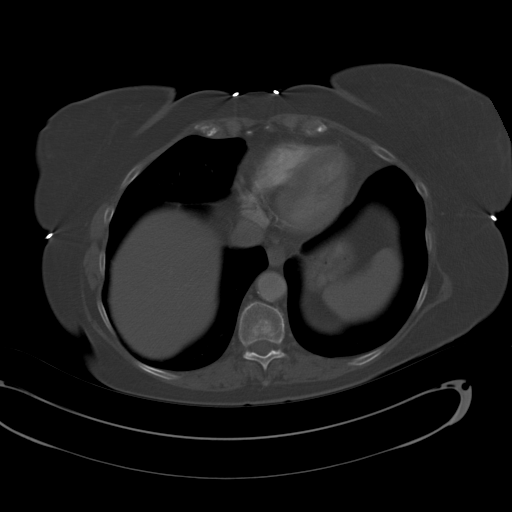
[im 103/131  soft-tissue]
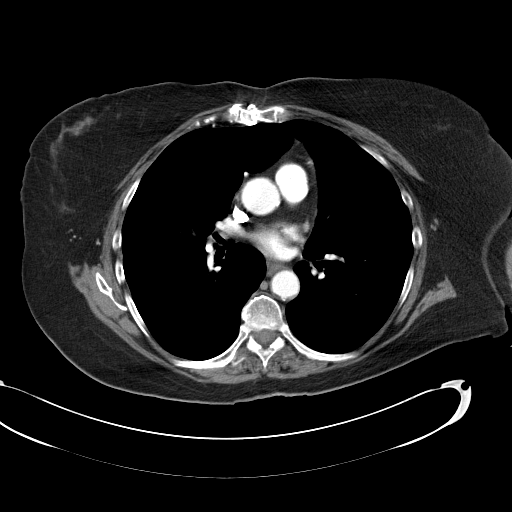
[im 110/131  soft-tissue]
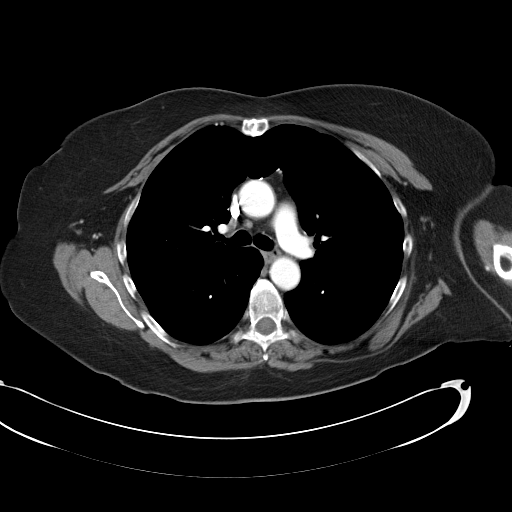
[im 124/131  soft-tissue]
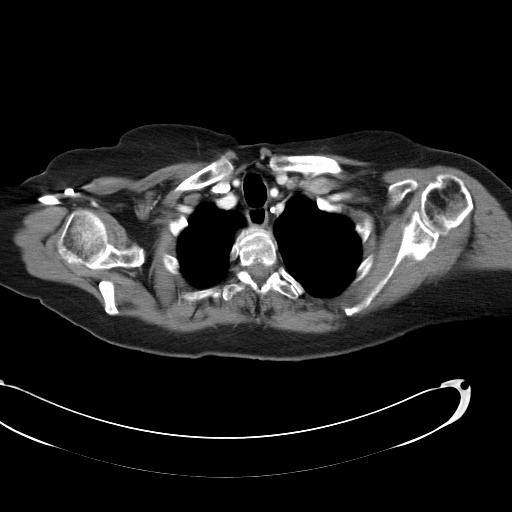

[Series 602: cor · coronal · 1.28mm/px · 3 of 82 slices shown]
[im 28/82  soft-tissue]
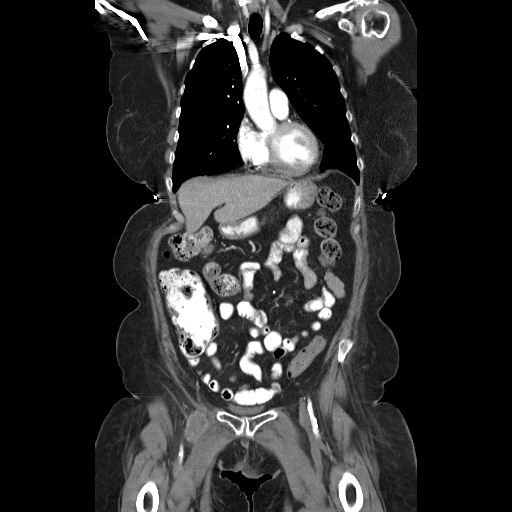
[im 37/82  soft-tissue]
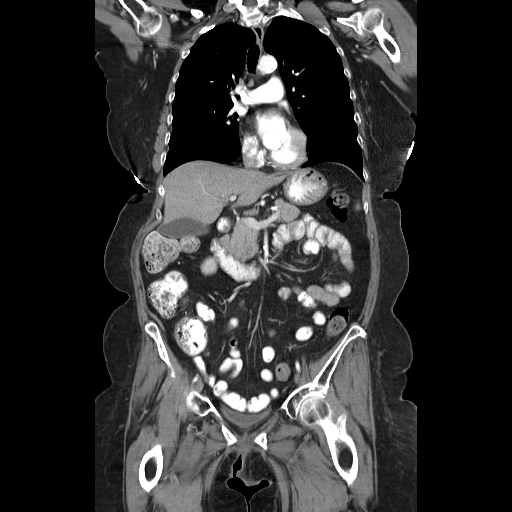
[im 46/82  soft-tissue]
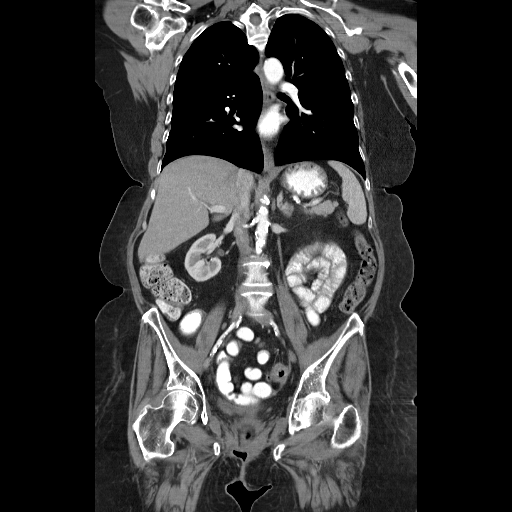

[15 of 46 positions shown; findings below may reference images not displayed]

FINDINGS: CT CHEST

Mediastinum/Nodes: Normal heart size. No pericardial
fluid/thickening. Left main, left anterior descending, left
circumflex and right coronary atherosclerosis status post CABG with
ascending aortic and left internal mammary bypass grafts. Great
vessels are normal in course and caliber. No central pulmonary
emboli. Normal visualized thyroid. Normal esophagus. No
pathologically enlarged axillary, mediastinal or hilar lymph nodes.

Lungs/Pleura: No pneumothorax. No pleural effusion. Mild
centrilobular emphysema and diffuse bronchial wall thickening. There
is solid noncalcified 5 mm anterior right upper lobe pulmonary
nodule (series 4/ image 15). There is a calcified 2 mm right upper
lobe pulmonary nodule on series 4/image 20). A calcified 2 mm left
upper lobe pulmonary nodule is seen on series 4/image 10). There is
a solid lobular 8 x 4 mm (average 6 mm diameter) medial left lower
lobe pulmonary nodule (series 4/image 30). No acute consolidative
airspace disease.

Musculoskeletal: No aggressive appearing focal osseous lesions. Mild
degenerative changes in the thoracic spine. Sternotomy wires are
intact.

CT ABDOMEN AND PELVIS

Hepatobiliary: Normal liver with no liver mass. Normal gallbladder
with no radiopaque cholelithiasis. No biliary ductal dilatation.

Pancreas: Normal, with no mass or duct dilation.

Spleen: Normal size. No mass.

Adrenals/Urinary Tract: Normal adrenals. Normal kidneys with no
hydronephrosis and no renal mass. Normal bladder.

Stomach/Bowel: Grossly normal stomach. Normal caliber small bowel
with no small bowel wall thickening. Normal appendix. Mild sigmoid
diverticulosis. No definite colonic wall thickening or pericolonic
fat stranding.

Vascular/Lymphatic: Atherosclerotic nonaneurysmal abdominal aorta.
Patent portal, splenic, hepatic and renal veins. There is
heterogeneously enhancing clustered right external iliac
lymphadenopathy, with the largest right external iliac node
measuring 1.4 cm (series 2/ image 93). There is irregular bulky
clustered heterogeneously enhancing right inguinal lymphadenopathy,
with the largest right inguinal node measuring 2.6 cm (series 2/
image 109). No pathologically enlarged lymph nodes in the abdomen.

Reproductive: There are postsurgical changes from right vulvar
resection, with no discrete mass or fluid collection in the vulva or
vagina. Grossly normal uterus. No adnexal mass.

Other: No pneumoperitoneum, ascites or focal fluid collection.

Musculoskeletal: No aggressive appearing focal osseous lesions. Mild
degenerative changes in the lumbar spine.
IMPRESSION: 1. Clustered right external iliac and right inguinal nodal
metastases.
2. Postsurgical changes in the right vulva, with no discrete vulvar
or vaginal mass or fluid collection.
3. No evidence of metastatic disease in the abdomen.
4. Two subcentimeter pulmonary nodules, largest 6 mm in the left
lower lobe, indeterminate, lung metastases not excluded, recommend
comparison with any prior outside imaging.
5. Mild centrilobular emphysema and diffuse bronchial wall
thickening, suggesting COPD.

## 2016-01-21 ENCOUNTER — Other Ambulatory Visit: Payer: Self-pay | Admitting: Oncology

## 2016-01-23 ENCOUNTER — Ambulatory Visit (HOSPITAL_BASED_OUTPATIENT_CLINIC_OR_DEPARTMENT_OTHER): Payer: Medicare Other | Admitting: Oncology

## 2016-01-23 ENCOUNTER — Encounter: Payer: Self-pay | Admitting: Oncology

## 2016-01-23 ENCOUNTER — Other Ambulatory Visit (HOSPITAL_BASED_OUTPATIENT_CLINIC_OR_DEPARTMENT_OTHER): Payer: Medicare Other

## 2016-01-23 VITALS — BP 96/61 | HR 100 | Temp 97.9°F | Resp 18 | Ht 63.0 in | Wt 156.0 lb

## 2016-01-23 DIAGNOSIS — D6481 Anemia due to antineoplastic chemotherapy: Secondary | ICD-10-CM | POA: Diagnosis not present

## 2016-01-23 DIAGNOSIS — L089 Local infection of the skin and subcutaneous tissue, unspecified: Secondary | ICD-10-CM

## 2016-01-23 DIAGNOSIS — G893 Neoplasm related pain (acute) (chronic): Secondary | ICD-10-CM

## 2016-01-23 DIAGNOSIS — C7800 Secondary malignant neoplasm of unspecified lung: Secondary | ICD-10-CM

## 2016-01-23 DIAGNOSIS — N9089 Other specified noninflammatory disorders of vulva and perineum: Secondary | ICD-10-CM

## 2016-01-23 DIAGNOSIS — E876 Hypokalemia: Secondary | ICD-10-CM

## 2016-01-23 DIAGNOSIS — C519 Malignant neoplasm of vulva, unspecified: Secondary | ICD-10-CM

## 2016-01-23 DIAGNOSIS — B9689 Other specified bacterial agents as the cause of diseases classified elsewhere: Secondary | ICD-10-CM

## 2016-01-23 DIAGNOSIS — G620 Drug-induced polyneuropathy: Secondary | ICD-10-CM

## 2016-01-23 DIAGNOSIS — I878 Other specified disorders of veins: Secondary | ICD-10-CM

## 2016-01-23 DIAGNOSIS — C775 Secondary and unspecified malignant neoplasm of intrapelvic lymph nodes: Secondary | ICD-10-CM

## 2016-01-23 DIAGNOSIS — C7801 Secondary malignant neoplasm of right lung: Secondary | ICD-10-CM

## 2016-01-23 LAB — CBC WITH DIFFERENTIAL/PLATELET
BASO%: 0.8 % (ref 0.0–2.0)
Basophils Absolute: 0.1 10*3/uL (ref 0.0–0.1)
EOS ABS: 0.2 10*3/uL (ref 0.0–0.5)
EOS%: 1.7 % (ref 0.0–7.0)
HCT: 35.4 % (ref 34.8–46.6)
HGB: 11.6 g/dL (ref 11.6–15.9)
LYMPH%: 10 % — AB (ref 14.0–49.7)
MCH: 29.2 pg (ref 25.1–34.0)
MCHC: 32.8 g/dL (ref 31.5–36.0)
MCV: 89.2 fL (ref 79.5–101.0)
MONO#: 0.9 10*3/uL (ref 0.1–0.9)
MONO%: 8.6 % (ref 0.0–14.0)
NEUT%: 78.9 % — AB (ref 38.4–76.8)
NEUTROS ABS: 8.5 10*3/uL — AB (ref 1.5–6.5)
PLATELETS: 348 10*3/uL (ref 145–400)
RBC: 3.97 10*6/uL (ref 3.70–5.45)
RDW: 15.2 % — ABNORMAL HIGH (ref 11.2–14.5)
WBC: 10.8 10*3/uL — AB (ref 3.9–10.3)
lymph#: 1.1 10*3/uL (ref 0.9–3.3)

## 2016-01-23 LAB — COMPREHENSIVE METABOLIC PANEL
ALT: 10 U/L (ref 0–55)
ANION GAP: 13 meq/L — AB (ref 3–11)
AST: 20 U/L (ref 5–34)
Albumin: 3.1 g/dL — ABNORMAL LOW (ref 3.5–5.0)
Alkaline Phosphatase: 40 U/L (ref 40–150)
BILIRUBIN TOTAL: 0.48 mg/dL (ref 0.20–1.20)
BUN: 14.8 mg/dL (ref 7.0–26.0)
CHLORIDE: 100 meq/L (ref 98–109)
CO2: 25 meq/L (ref 22–29)
Calcium: 8.9 mg/dL (ref 8.4–10.4)
Creatinine: 1.4 mg/dL — ABNORMAL HIGH (ref 0.6–1.1)
EGFR: 39 mL/min/{1.73_m2} — AB (ref >90)
GLUCOSE: 126 mg/dL (ref 70–140)
POTASSIUM: 3.8 meq/L (ref 3.5–5.1)
SODIUM: 139 meq/L (ref 136–145)
TOTAL PROTEIN: 6.7 g/dL (ref 6.4–8.3)

## 2016-01-23 LAB — MAGNESIUM: Magnesium: 1.6 mg/dl (ref 1.5–2.5)

## 2016-01-23 MED ORDER — OXYCODONE HCL ER 20 MG PO T12A: 20.0000 mg | EXTENDED_RELEASE_TABLET | Freq: Two times a day (BID) | ORAL | Status: DC

## 2016-01-23 MED ORDER — LIDOCAINE 5 % EX PTCH
1.0000 | MEDICATED_PATCH | CUTANEOUS | Status: AC
Start: 2016-01-23 — End: ?

## 2016-01-23 NOTE — Progress Notes (Signed)
OFFICE PROGRESS NOTE   January 23, 2016   Physicians: D.ClarkePearson/ E.Emmit Alexanders (PCP [facility], [place] [phone]), Marguerita Merles (radiation oncology [place] Allegra), Roslynn Amble [facility] cardiology [place] [phone] fax [phone]), Dorrene German (vascular surgery Mikel Cella) ([place] gyn oncology [facility] Adonis Housekeeper cardiovascular surgery Mikel Cella)  INTERVAL HISTORY:   Patient is seen, alone for visit, in continuing attention to metastatic squamous cell carcinoma of vulva, for which we have been trying CDDP taxol in palliative attempt since progression after initial chemoradiation then palliative right radical vulvectomy 07-26-15. She had cycle 3 CDDP taxol on 12-09-15, held since then with progressive lesions on right inner buttock. Most recent imaging was CT CAP 12-14-15, with 4 bilateral pulmonary nodules up to 8 x 4 mm, and right inguinal and external iliac adenopathy.  Patient completed cipro with flagyl on 01-21-16, with no symptomatic improvement in the ulcerated areas. She is using oxycodone 10 mg ~ every 6 hrs, which is not controlling pain at right buttocks or right inguinal area; PCP gave another prescription for the pain medication on 01-12-16 so that patient would not need to come back to [facility] for that.  Stools are loose but has not had watery diarrhea. Taste is still unpleasant from Flagyl (this used with cipro due to history of C diff). She deneis fever, bleeding. Increased SOB, increased swelling in RLE. Patient reports no sensation from the innermost ulceration, that area without sensation since vulvectomy; her pain is from the progressive areas posterior to this. Remainder of 10 point Review of Systems negative    Flu vaccine 09-05-15  No central catheter    ONCOLOGIC HISTORY Patient presented to PCP Feb 2016 with increasing right vulvar mass measuring 8 x 4 x 3 cm, biopsy with squamous cell carcinoma. PET  had uptake in bilateral inguinal nodes and 3 nonspecific lung nodules. US biopsy lymph node at [facility] 02-04-15 [id number]) documented metastatic squamous cell carcinoma. She received radiation by Dr Erlene Quan with sensitizing CDDP chemotherapy, the radiation given from 02-17-15 thru 04-19-15, which was 6300 cGy in 35 fractions to all PET avid disease and 4500 cGy in 25 fractions to all areas at risk of harboring microscopic disease including the pelvis. Chemotherapy records are not available at time of this visit. She had one break in therapy due to vulvitis, diarrhea and at least one episode of low potassium during treatment, but no significant nausea. Initial follow up showed good response, however she had ulcerative lesion on vulva by 07-06-15. Repeat PET 07-04-15 showed residual activity in the vulvar region, resolution of left inguinal nodes, decrease in right inguinal nodes (although some activity remains) and 2 new right external iliac nodes, 3 pulmonary nodules stable with a new 5 mm pulmonary nodule. She was referred from Dr Erlene Quan to Dr Josephina Shih, with consultation on 07-15-15, recommendation for radical resection of right vulvar lesion as palliative procedure, with plan to follow up the external iliac node and possible pulmonary lesions after surgery. She had extended right radical vul[facility] 07-26-15 at [facility]; final pathology [id number]) showed G3 poorly differentiated squamous cell carcinoma with negative margins. She had post op follow up by Dr Josephina Shih on 08-05-15, with perineal wound separation then; she subsequently saw Dr Denman George on 9-9, 9-19 and again today. PET at [facility] 08-23-15 had uptake in vulvar area possibly surgical, increase in RUL lung nodule from 5 mm to 7 mm now hypermetabolic with SUV 2.7, new hypermetabolic left inguinal node, increase in hypermetabolic right inguinal node  and right obturator node, increase in size (?) or RLL lung nodule with other lung nodules stable, and stable  hypermetabolic right external iliac node. CDDP taxol began 09-29-15. Cycle 3 delayed with C diff diarrhea diagnosed 11-08-15. CT AP 12-14-15 after 3 cycles showed 4 scattered pulmonary nodules up to 8 mm LLL and right inguinal/ right external iliac adenopathy   Objective:  Vital signs in last 24 hours:  BP 96/61 mmHg  Pulse 100  Temp(Src) 97.9 F (36.6 C) (Oral)  Resp 18  Ht 5' 3"  (1.6 m)  Wt 156 lb (70.761 kg)  BMI 27.64 kg/m2  SpO2 97% Weight down 3.5 lbs Alert, oriented and appropriate. Ambulatory without assistance. Obviously uncomfortable. Respirations not labored RA.  HEENT:PERRL, sclerae not icteric. Oral mucosa moist without lesions, posterior pharynx clear.  Neck supple. No JVD.  Lymphatics:no cervical,supraclavicular adenopathy. Firm mass in right inguinal area with tissues indurated there, tender. Resp: diminished BS thruout without wheezes or rales, no dullness to percussion bilaterally Cardio: regular rate and rhythm. No gallop. GI: soft, nontender, not distended, no mass or organomegaly. Few bowel sounds.  Musculoskeletal/ Extremities: without pitting edema, cords, tenderness Neuro: no change peripheral neuropathy. Otherwise nonfocal. PSYCH appropriate mood and affect Skin Areas have progressed on right buttock, with the 2 more posterior areas now larger and with frank ulcerations identical to the more anteroir area. Skin otherwise without rash, ecchymosis, petechiae  Lab Results:  Results for orders placed or performed in visit on 01/23/16  CBC with Differential  Result Value Ref Range   WBC 10.8 (H) 3.9 - 10.3 10e3/uL   NEUT# 8.5 (H) 1.5 - 6.5 10e3/uL   HGB 11.6 11.6 - 15.9 g/dL   HCT 35.4 34.8 - 46.6 %   Platelets 348 145 - 400 10e3/uL   MCV 89.2 79.5 - 101.0 fL   MCH 29.2 25.1 - 34.0 pg   MCHC 32.8 31.5 - 36.0 g/dL   RBC 3.97 3.70 - 5.45 10e6/uL   RDW 15.2 (H) 11.2 - 14.5 %   lymph# 1.1 0.9 - 3.3 10e3/uL   MONO# 0.9 0.1 - 0.9 10e3/uL   Eosinophils Absolute  0.2 0.0 - 0.5 10e3/uL   Basophils Absolute 0.1 0.0 - 0.1 10e3/uL   NEUT% 78.9 (H) 38.4 - 76.8 %   LYMPH% 10.0 (L) 14.0 - 49.7 %   MONO% 8.6 0.0 - 14.0 %   EOS% 1.7 0.0 - 7.0 %   BASO% 0.8 0.0 - 2.0 %  Comprehensive metabolic panel  Result Value Ref Range   Sodium 139 136 - 145 mEq/L   Potassium 3.8 3.5 - 5.1 mEq/L   Chloride 100 98 - 109 mEq/L   CO2 25 22 - 29 mEq/L   Glucose 126 70 - 140 mg/dl   BUN 14.8 7.0 - 26.0 mg/dL   Creatinine 1.4 (H) 0.6 - 1.1 mg/dL   Total Bilirubin 0.48 0.20 - 1.20 mg/dL   Alkaline Phosphatase 40 40 - 150 U/L   AST 20 5 - 34 U/L   ALT 10 0 - 55 U/L   Total Protein 6.7 6.4 - 8.3 g/dL   Albumin 3.1 (L) 3.5 - 5.0 g/dL   Calcium 8.9 8.4 - 10.4 mg/dL   Anion Gap 13 (H) 3 - 11 mEq/L   EGFR 39 (L) >90 ml/min/1.73 m2  Magnesium - [facility]  Result Value Ref Range   Magnesium 1.6 1.5 - 2.5 mg/dl     Studies/Results:  No results found.  Medications: I have reviewed the patient's  current medications. Will add oxycontin and lidoderm patch to right inguinal area. Patient understands that these scripts may need authorization and that she should use oxycodone 5-15 mg every 4 hrs prn  Patient reports taking five of an OTC potasium tablet daily x 3-4 years, which she began on her own; exact type of potassium and dose not clear. Patient will let this office know type of K and tablet strength, as she may need less # tablets with prescription.  DISCUSSION Discussed use of sustained release pain medication in addition to break thru immediate release. Until lidoderm patch available, she is to use viscous lidocaine (mixed in desitin fine) to right groin area also.   I do not think chemo this week is appropriate. Have discussed with gyn oncology, to be seen back there. Note she does not have an easy time with chemotherapy, which is being used in palliative attempt; she may have better palliation of symptoms with less aggressive interventions and better pain management for  now.  Assessment/Plan: 1.Metastatic squamous cell carcinoma of vulva: local progression following maximal radiation with sensitizing chemotherapy given in [facility] thru 04-19-15, subsequent palliative right radical vulvectomy at [facility] 07-26-15. Pulmonary metastatic disease and node involvement seems mostly stable comparing present CT with PET report from Sept. Wound healing slowly, progressive ulcerated areas inner right buttock despite treatment for klebsiella and pseudomonas found on cultures. Increase pain medication. No chemo this week. 2. C diff colitis documented late Nov. No clear symptoms now, used flagyl in preventative fashion with recent cipro. 3.poor peripheral IV access: patient now in agreement with PAC if chemo continues. Note unable even to draw labs peripherally last visit 4.coronary artery disease post MI and 4 vessel CABG 2014. Recent follow up by Dr Daine Floras, no changes. Hyperlipidemia. Some transient pedal edema but no significant CHF symptoms with cisplatin hydration. Peripheral vascular disease with completely occluded right carotid and post left carotid endarterectomy 5.flu vaccine 08-2015. Pneumonia vaccine 08-2015 6.GERD and nausea improved with Pepcid AC, continue. 7.never mammograms 8.never colonoscopy 9.no advance directives 10. Orthopedic surgeries clavicle and left knee 11.social situation: lives an hour away, but requests treatment in [place]. Mild anemia related to surgery, chemo, chronic disease, blood draws. continue ferrous fumarate. 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 15.hypoK, hypoMG and fluid overload/ CHF with initial sensitizing CDDP: following closely for these problems. Supplementing K and Mg in IVF and on some dose of oral K to be clarified 16.40 pack year tobacco DCd 04-2013   All questions answered. Time spent 25 min including >50%  counseling and coordination of care. CC Drs Ulice Brilliant, Daine Floras, Lacie Draft, MD   01/23/2016, 1:16 PM

## 2016-01-24 ENCOUNTER — Telehealth: Payer: Self-pay | Admitting: *Deleted

## 2016-01-24 ENCOUNTER — Encounter: Payer: Self-pay | Admitting: Oncology

## 2016-01-24 NOTE — Telephone Encounter (Signed)
Patient called.  She got two prescriptions from Dr. Marko Plume and having problems with both of them.. 1. Lidoderm 5% - needed prior authorization.  Pharmacy sent paper work and they will try every am at 8am to see if it has gone through with approval. 2. Oxycontin 20mg .  Pharmacy told her it was on back order - maybe 6 months or more.  Spoke with pharmacist and their supplier did notify them of the back order.  He tried other pharmacies in the area and was unable to get it.   I spoke with [facility] and they have some and are not aware of any back order.  Let them know of this patient's situation and patient could fill the script there.  Let patient know that she should get the script back from the [facility] (which is close by to her) and she can get this filled at [facility].  Patient thanked me for the information but stated she did not feel like driving all the way to [facility] to have this filled.  Let her know that was ok, but to get the script back so that if she changed her mind, she would have a script available for her.  Also gave her phone number for [facility] so she could call and check to make sure they have it before she makes the trip.  She very much appreciated my help with all of this.  She also wanted Dr. Marko Plume to know and I assured her I would send a note to Dr. Marko Plume about this.

## 2016-01-24 NOTE — Progress Notes (Signed)
I faxed prior auth form to [facility] for oxycodone

## 2016-01-25 ENCOUNTER — Encounter: Payer: Self-pay | Admitting: Oncology

## 2016-01-25 NOTE — Progress Notes (Signed)
I sent prior auth for lidocaine patch to [facility]

## 2016-01-26 ENCOUNTER — Other Ambulatory Visit: Payer: Self-pay | Admitting: Oncology

## 2016-01-26 NOTE — Progress Notes (Signed)
Amy Mayer from [facility] called [facility] Ros[facility]ti. The form that Raquel sent was incorrect. Amy Mayer will send a corrected PA request form to [facility] for oxycontin. The patient will continue taking oxycodone until we get PA for oxycontin. Raquel informed of new PA coming to her.

## 2016-01-27 ENCOUNTER — Encounter: Payer: Self-pay | Admitting: Oncology

## 2016-01-27 ENCOUNTER — Telehealth: Payer: Self-pay | Admitting: *Deleted

## 2016-01-27 NOTE — Telephone Encounter (Signed)
Spoke with Dr. Marko Plume  regarding ER oxycodone not being PA and Amy Mayer pain. Dr. Marko Plume said that Amy Mayer can take the OxyIR 5mg  tabs 3-4 tabs every 3-4 hours as needed for pain. She can also apply the Viscous Lidocaine mixed with Desitin to her right groin to help with the pain until PA for Lidoderm 5%  patch is received. Spoke with Amy Mayer and gave her the instructions.  Amy Mayer to keep appointment with Dr. Denman George on 01-30-16 at 1545.

## 2016-01-27 NOTE — Progress Notes (Signed)
Sent prior auth req to [employer]

## 2016-01-27 NOTE — Progress Notes (Signed)
Recd mess must send an appeal since denied in last 60days. I will send for an appeal again-notes/labs

## 2016-01-27 NOTE — Telephone Encounter (Signed)
"  I want to know who made the mistake entering my medications.  I went to the pharmacy and was told something was entered incorrectly.  Tried at [facility] and they can't get it for me either.  I am having pain and need to know when I can get my medication?  I live too far to come there." Advised the prior authorization was sent in.  We are waiting response from [facility].  "You still have not answered my question.  WHEN will I get my medicine."    Will notify [employer] and ask they call patient with [employer]'s decision.

## 2016-01-28 ENCOUNTER — Telehealth: Payer: Self-pay

## 2016-01-28 NOTE — Telephone Encounter (Signed)
Spoke with [place] at [facility].  The prescription for the Lidoderm patches went through so the PA was accepted. #30 patches  isn $150+ copay.   Requested if [facility] could let Ms. Pangallo know that the path was approved.  If patient cannot pay for #30 then shee is she could afford a 15 day supply to see if it is effective with pain control. Earnest Bailey is glad to call Ms. Lembcke.  The Oxycontin is not on the [facility] formulary and the appeal for the PA was denied. OxyIR is on their formulary. Patient allergic to Morphine so she cannot use [facility] Contin  on formulary.   Fentanyl Patches are on the formulary and might be an extented release pain med option.  Will give information to Dr. Marko Plume to review. Patient ;has an appointment on 01-30-16 at 1545 with Gyn Onc Dr. Denman George.

## 2016-01-30 ENCOUNTER — Ambulatory Visit (HOSPITAL_COMMUNITY)
Admission: RE | Admit: 2016-01-30 | Discharge: 2016-01-30 | Disposition: A | Discharge status: Home / Self Care | Payer: Medicare Other | Source: Ambulatory Visit | Attending: Gynecologic Oncology | Admitting: Gynecologic Oncology

## 2016-01-30 ENCOUNTER — Encounter: Payer: Self-pay | Admitting: Gynecologic Oncology

## 2016-01-30 ENCOUNTER — Telehealth: Payer: Self-pay

## 2016-01-30 ENCOUNTER — Encounter: Payer: Self-pay | Admitting: Oncology

## 2016-01-30 ENCOUNTER — Other Ambulatory Visit: Payer: Self-pay | Admitting: Oncology

## 2016-01-30 ENCOUNTER — Ambulatory Visit (HOSPITAL_BASED_OUTPATIENT_CLINIC_OR_DEPARTMENT_OTHER): Payer: Medicare Other | Admitting: Gynecologic Oncology

## 2016-01-30 VITALS — BP 114/56 | HR 106 | Temp 98.6°F | Resp 18 | Ht 63.0 in | Wt 152.7 lb

## 2016-01-30 DIAGNOSIS — M25562 Pain in left knee: Secondary | ICD-10-CM

## 2016-01-30 DIAGNOSIS — S31819A Unspecified open wound of right buttock, initial encounter: Secondary | ICD-10-CM

## 2016-01-30 DIAGNOSIS — I2510 Atherosclerotic heart disease of native coronary artery without angina pectoris: Secondary | ICD-10-CM | POA: Insufficient documentation

## 2016-01-30 DIAGNOSIS — Z923 Personal history of irradiation: Secondary | ICD-10-CM | POA: Diagnosis not present

## 2016-01-30 DIAGNOSIS — Z7982 Long term (current) use of aspirin: Secondary | ICD-10-CM | POA: Insufficient documentation

## 2016-01-30 DIAGNOSIS — W19XXXA Unspecified fall, initial encounter: Secondary | ICD-10-CM | POA: Insufficient documentation

## 2016-01-30 DIAGNOSIS — Z880 Allergy status to penicillin: Secondary | ICD-10-CM | POA: Diagnosis not present

## 2016-01-30 DIAGNOSIS — Z9221 Personal history of antineoplastic chemotherapy: Secondary | ICD-10-CM | POA: Diagnosis not present

## 2016-01-30 DIAGNOSIS — Z885 Allergy status to narcotic agent status: Secondary | ICD-10-CM | POA: Diagnosis not present

## 2016-01-30 DIAGNOSIS — Z79899 Other long term (current) drug therapy: Secondary | ICD-10-CM | POA: Insufficient documentation

## 2016-01-30 DIAGNOSIS — C519 Malignant neoplasm of vulva, unspecified: Secondary | ICD-10-CM | POA: Diagnosis not present

## 2016-01-30 DIAGNOSIS — E785 Hyperlipidemia, unspecified: Secondary | ICD-10-CM | POA: Insufficient documentation

## 2016-01-30 DIAGNOSIS — L98419 Non-pressure chronic ulcer of buttock with unspecified severity: Secondary | ICD-10-CM | POA: Diagnosis not present

## 2016-01-30 DIAGNOSIS — I252 Old myocardial infarction: Secondary | ICD-10-CM | POA: Diagnosis not present

## 2016-01-30 DIAGNOSIS — Z87891 Personal history of nicotine dependence: Secondary | ICD-10-CM | POA: Insufficient documentation

## 2016-01-30 DIAGNOSIS — I10 Essential (primary) hypertension: Secondary | ICD-10-CM | POA: Diagnosis not present

## 2016-01-30 MED ORDER — FENTANYL 12 MCG/HR TD PT72: 12.5000 ug | MEDICATED_PATCH | TRANSDERMAL | Status: AC

## 2016-01-30 NOTE — Progress Notes (Signed)
Sent to medical records. Per [employer] lidocaine patch approved until 07/26/16

## 2016-01-30 NOTE — Patient Instructions (Signed)
Plan to go to [facility] for an xray of your left knee.  We will also make a referral for you at the [facility] [place].

## 2016-01-30 NOTE — Progress Notes (Signed)
Followup Note: Gyn-Onc   Amy Mayer 71 y.o. female  Chief Complaint  Patient presents with  . Vulvar Cancer    Follow up    Assessment :   Recurrent carcinoma of the vulva (right) status post radical vulvectomy on 07/26/2015. Perineal wound separation. Postoperative wound infection - healing.  Evidence of systemic disease on postop PET (pelvic lymphadenopathy, inguinal lymphadenopathy, increasing pulmonary nodule), s/p 3 cycles of salvage chemotherapy with cisplatin and paclitaxel. Apparently stable disease on CT imaging.   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  Recent fall with left knee pain.  Plan:   Plan to continue cisplatin and paclitaxel (will compare images from PET on September 2016 to this CT) when well enough to do so (resolution of diarrhea)   Referral to wound care to be evaluate for an alternative strategy to aid in healing of buttock wounds.  Xray of left knee to rule out fracture given pain and edema.  Counseled regarding rice and rehydration for her diarrhea  HPI: 70 year old white married female seen in consultation at the request of Dr. Marguerita Merles (radiation oncologist at [facility] [place]) regarding management of recurrent vulvar carcinoma.  The patient initially presented in the spring of 2016 with an enlarging right vulvar mass involving the right vulva measured approximate 8 x 4 x 3 cm. Biopsy revealed squamous cell carcinoma. Patient underwent a PET scan showing uptake in bilateral inguinal nodes and 3 nonspecific lung nodules. She received definitive chemoradiation therapy. Radiation course extended between 02/17/2015 and 04/19/2015. She received a total of 6300 cGy in 35 fractions to all PET avid disease and 4500 cGy in 25 fractions to all areas at risk of harboring microscopic disease including the pelvis. She had one break in therapy due to vulvitis. Initial  follow-up examination showed complete response although on July 27 Dr. Erlene Quan found a ulcerative lesion consistent with recurrent disease. A subsequent PET scan showed some residual activity in the vulvar region, resolution of left inguinal nodes, decrease in right inguinal nodes (although some activity remains) and 2 new right external iliac nodes. The 3 pulmonary nodules are stable although there is a new 5 mm pulmonary nodule as well.  She underwent extended radical vulvectomy of the right vulvar lesion on 07/26/2015 at [facility]. All surgical margins were negative.  She developed a postoperative wound separation and cellulitis which was debrided and then treated with wet to dry dressings.   She saw her Radiation Oncologist, Dr Erlene Quan, in [place] in September, 2016 and reviewed her PET from [facility].  On 08/23/15 it demonstrated a new 14 x 8 mm left inguinal lymph node that was FDG avid. An increase in hypermetabolic right inguinal node measuring 17 x 15 mm that was FDG avid. An increase in the size of a right upper lobe lung nodule now measuring 7 mm (from 5 mm) that was FDG avid. Scattered additional small lung nodules were stable in size. There was right external iliac lymph node measuring 10 mm that was FDG avid. A right obturator lymph node measured 13 mm and was also FDG appendectomy. There was surrounding hypermetabolic activity in the region of the vulva.  The patient started adjuvant cisplatin, paclitaxel on 10/05/15. Cycle 3 was delayed to 12/09/15.  CT chest abdomen and pelvis on 12/14/2015 (after cycle 3) revealed a solid noncalcified 5 mm anterior right upper lobe pulmonary nodule, a 2 mm right upper lobe pulmonary nodule, a calcified 2 mm left  upper lobe pulmonary nodule, and a solid lobular 8 x 4 mm medial left lower lobe pulmonary nodule. There is clustered right external iliac lymphadenopathy with the largest node measuring 1.4 cm. There was irregular bulky clustered  heterogeneously enhancing right inguinal lymphadenopathy with the largest right inguinal node measuring 2.6 cm.  Interval Hx: In December 2016 she developed ulcers on her right buttock that were biopsied (benign) and cultured. They have become progressively deeper.   She has had recurrent c diff diarrhea, most recently in the past week. It is getting better today with semi-formed stool. She fell and hit her left knee painfully. It hurts to move and touch.  Review of Systems:10 point review of systems is negative except as noted in interval history. Except for diarrhea and left knee pain and buttock ulcers  Vitals: Blood pressure 114/56, pulse 106, temperature 98.6 F (37 C), temperature source Oral, resp. rate 18, height 5\' 3"  (1.6 m), weight 152 lb 11.2 oz (69.264 kg), SpO2 96 %.  Physical Exam: General : The patient is a healthy woman in no acute distress.  HEENT: normocephalic, extraoccular movements normal; neck is supple without thyromegally  Lynphnodes: Supraclavicular. There is thickening in the inguinal regions (right>left) secondary to recent radiation therapy (the right is no longer tender) and "tanning" of the skin. Abdomen: Soft, non-tender, no ascites, no organomegally, no masses, no hernias  Pelvic:  EGBUS:  Status post radical vulvectomy. The advancement flaps seen to be healing well at the present time. However in the midline perineum there is separation of the wound.   3 ulcerated lesions on right medial buttock approximating vulva posteriorally. Fibrinous debris in base. Very tender. Slightly larger than previous exam Urethra and Bladder: Normal, non-tender  Cervix: Unable to visualize. Uterus: Could not evaluate.   Rectal: normal sphincter tone, no masses, no blood , the anus and rectum are free from the ulcerative lesion. Lower extremities: edema and tenderness medial aspect left knee (medial condyle).     Allergies  Allergen Reactions  . Aspirin Other (See  Comments)    "Only uncoated" makes stomach upset  . Codeine Nausea And Vomiting  . Penicillins Hives  . Morphine Anxiety    Past Medical History  Diagnosis Date  . Arthritis   . Hypertension   . Myocardial infarction ([place]) 04/2013  . Hyperlipidemia   . Coronary artery disease   . Cataracts, bilateral   . Carotid stenosis     Past Surgical History  Procedure Laterality Date  . Tonsillectomy  1964  . Aortic valve replacement (avr)/coronary artery bypass grafting (cabg)  2014  . Carotid endarterectomy  10/18/2014  . Clavicle surgery  1997  . Cardiac catheterization      Current Outpatient Prescriptions  Medication Sig Dispense Refill  . aspirin EC 81 MG tablet Take 81 mg by mouth.    . cetirizine (ZYRTEC) 10 MG tablet Take 10 mg by mouth as needed. Reported on 12/26/2015    . Cholecalciferol (VITAMIN D-3) 1000 UNITS CAPS Take 1,000 Units by mouth daily.     Marland Kitchen dexamethasone (DECADRON) 4 MG tablet Take 5 tablets =20 mg  with food 12 hrs and 6 hrs prior to Taxol. (Patient not taking: Reported on 12/26/2015) 20 tablet 1  . famotidine (PEPCID) 20 MG tablet Take 20 mg by mouth daily.    . ferrous fumarate (HEMOCYTE - 106 MG FE) 325 (106 FE) MG TABS tablet Take 1 tablet daily with Vitamin C tablet. 30 each 2  . lidocaine (LIDODERM)  5 % Place 1 patch onto the skin daily. Remove & Discard patch within 12 hours or as directed by MD 30 patch 1  . lidocaine (LMX) 4 % cream Apply to affected area mixed with Desitin or zinc oxide as directed. 15 g 3  . LORazepam (ATIVAN) 0.5 MG tablet Take 0.25-0.5 mg by mouth every 6 (six) hours as needed. Reported on 01/23/2016    . Multiple Vitamin (THERA) TABS Take 1 tablet by mouth daily.     . Omega-3 Fatty Acids (FISH OIL) 1000 MG CAPS Take 2 capsules by mouth daily.     . ondansetron (ZOFRAN) 8 MG tablet Take by mouth every 8 (eight) hours as needed for nausea or vomiting. Reported on 12/26/2015    . oxyCODONE (OXY IR/ROXICODONE) 5 MG immediate release  tablet Take 5-10 mg by mouth every 4 (four) hours as needed.     Marland Kitchen oxyCODONE (OXYCONTIN) 20 mg 12 hr tablet Take 1 tablet (20 mg total) by mouth every 12 (twelve) hours. 60 tablet 0  . Potassium 95 MG TABS Take 475 mg by mouth daily.    . prochlorperazine (COMPAZINE) 10 MG tablet Reported on 12/26/2015    . rosuvastatin (CRESTOR) 20 MG tablet Take 20 mg by mouth at bedtime.    . vitamin C (ASCORBIC ACID) 500 MG tablet Take 500 mg by mouth daily. Reported on 12/06/2015     No current facility-administered medications for this visit.    Social History   Social History  . Marital Status: Married    Spouse Name: N/A  . Number of Children: N/A  . Years of Education: N/A   Occupational History  . Not on file.   Social History Main Topics  . Smoking status: Former Smoker -- 1.00 packs/day for 40 years    Quit date: 05/08/2013  . Smokeless tobacco: Not on file  . Alcohol Use: No     Comment: wine - several times a week  . Drug Use: No  . Sexual Activity: No   Other Topics Concern  . Not on file   Social History Narrative    Family History  Problem Relation Age of Onset  . Hypertension Father   . Coronary artery disease Father   . Alcoholism Father   . Diabetes Maternal Aunt   . Arthritis Mother   . Heart disease Brother     Donaciano Eva, MD 01/30/2016, 4:51 PM

## 2016-01-30 NOTE — Progress Notes (Signed)
Per [employer] lidocaine patch approved until 07/26/16. Sent to medical records

## 2016-01-30 NOTE — Telephone Encounter (Signed)
Spoke with Ms. Ruminski while in office to see Dr. Denman George this afternoon.  She stated that she did not need any Oxy IR  5 mg (3 tabs) since Thursday 01-26-16 until 3 am this morning and 1430. Told her that the OxyContin 20 mg was prior authorized by [facility], however, it is a Tier 4 medication.  #60 tabs for a months supply would cost $ 384.48 since she has not met her prescription deductible of $400.  After the deductible is met she would pay 35% of the cost of the med.  Cost would be 160.23 at a [employer]. Duragesic is on [employer] 's formulary but is a tier 4 drug as well. Dr. Marko Plume gave Ms. Purk a prescription for Duragesic 12.5 mg patches # 10.  If this Extended release medication is too expensive, She can continue with the Oxycodone 5 mg 3-4 tabs q 4 hrs. Prn. The Lidoderm patches were not picked up as the cost for these was prohibitive.

## 2016-01-31 ENCOUNTER — Telehealth: Payer: Self-pay | Admitting: Oncology

## 2016-01-31 ENCOUNTER — Telehealth: Payer: Self-pay

## 2016-01-31 ENCOUNTER — Telehealth: Payer: Self-pay | Admitting: Gynecologic Oncology

## 2016-01-31 NOTE — Telephone Encounter (Signed)
Orders received from [facility] to contact the patient to update with results from X-Ray of left knee obtained on 01/30/2016 . Impression : No acute bony abnormality. Patient contacted and updated , patient states understanding , denies further questions at this time.

## 2016-01-31 NOTE — Telephone Encounter (Signed)
Patient called stating she would like to be seen at the [facility] closer to her house and she was going to contact Dr. Ulice Brilliant to make a referral.  No other concerns voiced.  Referral to [place] will be cancelled.

## 2016-01-31 NOTE — Addendum Note (Signed)
Addended by: Joylene John D on: 01/31/2016 04:04 PM   Modules accepted: Orders

## 2016-01-31 NOTE — Addendum Note (Signed)
Addended by: Joylene John D on: 01/31/2016 01:49 PM   Modules accepted: Orders

## 2016-01-31 NOTE — Telephone Encounter (Signed)
Spoke with patient and confirmed appt for 3/9 per LL 2/20 pof

## 2016-02-01 ENCOUNTER — Telehealth: Payer: Self-pay

## 2016-02-01 NOTE — Telephone Encounter (Signed)
Amy Mayer stated that she is not going to deal with the OxyContin prescription. She is bringing the Fentanyl 12.5 mcg patch to the pharmacy to fill. Her PCP Dr. Ulice Brilliant is going to get her an appointment with a wound care center  Locally.  She told Joylene John, NP this information as well.

## 2016-02-06 ENCOUNTER — Ambulatory Visit: Payer: Medicare Other | Admitting: Gynecologic Oncology

## 2016-02-08 ENCOUNTER — Encounter: Payer: Self-pay | Admitting: Oncology

## 2016-02-08 NOTE — Progress Notes (Signed)
Per [employer] oxycodone was approved 01/30/16-12/09/16 ref# [id number]. I sent to medical records and let nurse know.

## 2016-02-09 ENCOUNTER — Other Ambulatory Visit: Payer: Medicare Other

## 2016-02-09 ENCOUNTER — Other Ambulatory Visit: Payer: Self-pay | Admitting: *Deleted

## 2016-02-09 ENCOUNTER — Encounter: Payer: Medicare Other | Admitting: Nurse Practitioner

## 2016-02-09 ENCOUNTER — Encounter: Payer: Self-pay | Admitting: *Deleted

## 2016-02-09 ENCOUNTER — Telehealth: Payer: Self-pay | Admitting: *Deleted

## 2016-02-09 DIAGNOSIS — R197 Diarrhea, unspecified: Secondary | ICD-10-CM

## 2016-02-09 NOTE — Telephone Encounter (Signed)
Pt states she thinks she needs to be admitted- Cannot eat- feels dehydrated, thinks c-diff is back- has had diarrhea X 4 days.  Dr Marko Plume would like pt to be seen by PCP or Selena Lesser. Pt wants to come to [facility].  Will be here at 1300 for labs, 1330 with [employer].

## 2016-02-13 NOTE — Telephone Encounter (Signed)
Called pt to f/u on no show Thursday. The family took her to [place] and she is currently admitted there.

## 2016-02-15 ENCOUNTER — Other Ambulatory Visit: Payer: Self-pay | Admitting: Oncology

## 2016-02-16 ENCOUNTER — Other Ambulatory Visit: Payer: Medicare Other

## 2016-02-16 ENCOUNTER — Ambulatory Visit: Payer: Medicare Other | Admitting: Oncology

## 2016-06-09 DEATH — deceased

## 2019-01-03 ENCOUNTER — Encounter: Payer: Self-pay | Admitting: Oncology

## 2019-01-03 ENCOUNTER — Other Ambulatory Visit: Payer: Self-pay | Admitting: Nurse Practitioner
